# Patient Record
Sex: Female | Born: 1955
Health system: Southern US, Community
[De-identification: ages and names within clinical notes are randomized; demographics above are authoritative.]

## PROBLEM LIST (undated history)

## (undated) DIAGNOSIS — I471 Supraventricular tachycardia, unspecified: Secondary | ICD-10-CM

## (undated) DIAGNOSIS — D649 Anemia, unspecified: Secondary | ICD-10-CM

## (undated) DIAGNOSIS — R002 Palpitations: Secondary | ICD-10-CM

## (undated) DIAGNOSIS — I10 Essential (primary) hypertension: Secondary | ICD-10-CM

## (undated) HISTORY — DX: Palpitations: R00.2

## (undated) HISTORY — PX: TUBAL LIGATION: SHX77

## (undated) HISTORY — PX: BREAST BIOPSY: SHX20

---

## 1999-05-23 ENCOUNTER — Other Ambulatory Visit: Admission: RE | Admit: 1999-05-23 | Discharge: 1999-05-23 | Payer: Self-pay | Admitting: Obstetrics and Gynecology

## 2000-06-09 ENCOUNTER — Other Ambulatory Visit: Admission: RE | Admit: 2000-06-09 | Discharge: 2000-06-09 | Payer: Self-pay | Admitting: Obstetrics and Gynecology

## 2002-12-20 ENCOUNTER — Other Ambulatory Visit: Admission: RE | Admit: 2002-12-20 | Discharge: 2002-12-20 | Payer: Self-pay | Admitting: Obstetrics and Gynecology

## 2004-04-02 ENCOUNTER — Other Ambulatory Visit: Admission: RE | Admit: 2004-04-02 | Discharge: 2004-04-02 | Payer: Self-pay | Admitting: Obstetrics and Gynecology

## 2005-09-23 ENCOUNTER — Other Ambulatory Visit: Admission: RE | Admit: 2005-09-23 | Discharge: 2005-09-23 | Payer: Self-pay | Admitting: Obstetrics and Gynecology

## 2008-09-27 ENCOUNTER — Ambulatory Visit: Payer: Self-pay | Admitting: Cardiology

## 2008-09-27 ENCOUNTER — Inpatient Hospital Stay (HOSPITAL_COMMUNITY): Admission: AD | Admit: 2008-09-27 | Discharge: 2008-09-28 | Payer: Self-pay | Admitting: Internal Medicine

## 2008-09-27 ENCOUNTER — Encounter: Payer: Self-pay | Admitting: Emergency Medicine

## 2008-10-04 ENCOUNTER — Encounter: Payer: Self-pay | Admitting: Cardiology

## 2008-10-04 ENCOUNTER — Ambulatory Visit: Payer: Self-pay | Admitting: Cardiology

## 2008-10-04 ENCOUNTER — Encounter (HOSPITAL_COMMUNITY): Admission: RE | Admit: 2008-10-04 | Discharge: 2008-10-16 | Payer: Self-pay | Admitting: Cardiology

## 2008-10-31 ENCOUNTER — Ambulatory Visit: Payer: Self-pay | Admitting: Cardiology

## 2008-12-12 ENCOUNTER — Ambulatory Visit (HOSPITAL_COMMUNITY): Admission: RE | Admit: 2008-12-12 | Discharge: 2008-12-12 | Payer: Self-pay | Admitting: Pulmonary Disease

## 2009-04-15 DIAGNOSIS — R079 Chest pain, unspecified: Secondary | ICD-10-CM

## 2009-04-15 DIAGNOSIS — R002 Palpitations: Secondary | ICD-10-CM

## 2009-06-14 ENCOUNTER — Ambulatory Visit (HOSPITAL_COMMUNITY): Admission: RE | Admit: 2009-06-14 | Discharge: 2009-06-14 | Payer: Self-pay | Admitting: General Surgery

## 2009-06-14 ENCOUNTER — Encounter (INDEPENDENT_AMBULATORY_CARE_PROVIDER_SITE_OTHER): Payer: Self-pay | Admitting: General Surgery

## 2010-11-09 ENCOUNTER — Encounter: Payer: Self-pay | Admitting: Family Medicine

## 2010-11-09 ENCOUNTER — Encounter: Payer: Self-pay | Admitting: Obstetrics and Gynecology

## 2011-01-24 LAB — BASIC METABOLIC PANEL
Chloride: 104 mEq/L (ref 96–112)
GFR calc non Af Amer: >60 mL/min (ref >60)
Potassium: 4.6 mEq/L (ref 3.5–5.1)
Sodium: 138 mEq/L (ref 135–145)

## 2011-01-24 LAB — CBC
HCT: 35.5 % — ABNORMAL LOW (ref 36.0–46.0)
Hemoglobin: 12 g/dL (ref 12.0–15.0)
MCV: 89.5 fL (ref 78.0–100.0)
RBC: 3.97 MIL/uL (ref 3.87–5.11)
WBC: 4.7 10*3/uL (ref 4.0–10.5)

## 2011-02-03 LAB — BLOOD GAS, ARTERIAL
Bicarbonate: 23.1 mEq/L (ref 20.0–24.0)
TCO2: 21.1 mmol/L (ref 0–100)
pCO2 arterial: 37.7 mmHg (ref 35.0–45.0)
pH, Arterial: 7.404 — ABNORMAL HIGH (ref 7.350–7.400)
pO2, Arterial: 98.5 mmHg (ref 80.0–100.0)

## 2011-03-03 NOTE — Op Note (Signed)
NAMECORREEN, BUBOLZ             ACCOUNT NO.:  [id number]   MEDICAL RECORD NO.:  [id number]          PATIENT TYPE:  AMB   LOCATION:  SDS                          FACILITY:  [facility]   PHYSICIAN:  Gabrielle Dare. Janee Morn, M.D.DATE OF BIRTH:  Jul 25, 1956   DATE OF PROCEDURE:  06/14/2009  DATE OF DISCHARGE:  06/14/2009                               OPERATIVE REPORT   PREOPERATIVE DIAGNOSIS:  Left breast mass.   POSTOPERATIVE DIAGNOSIS:  Left breast mass.   PROCEDURE:  Excision of left breast mass.   SURGEON:  Gabrielle Dare. Janee Morn, MD   ANESTHESIA:  General with laryngeal mask airway.   HISTORY OF PRESENT ILLNESS:  Ms. Lehan is a 55 year old African  American female who found a left breast mass during self breast  examination.  She went for further evaluation at [facility] and ultrasound demonstrated a complex cystic mass, 2.2 cm in  diameter at the 12 o'clock position just above her areola.  Core biopsy  was then done.  This showed a spot of atypical ductal hyperplasia, so  she presents today for excision of this breast mass.   PROCEDURE IN DETAIL:  Informed consent was obtained.  The patient was  identified in the preop holding area.  Her site was marked.  She  received intravenous antibiotics.  She was brought to the operating  room.  General anesthesia with laryngeal mask airway was administered by  the Anesthesia staff.  Her chest and left breast were prepped and draped  in sterile fashion.  Time-out procedure was done.  A segmental  circumareolar incision was made along the 12 o'clock position of her  areola.  Subcutaneous tissues were dissected down.  This mass was  palpable in the breast tissue.  It was circumferentially dissected using  Bovie cautery to get excellent hemostasis and excised in one piece.  This was marked with sutures for anatomic positioning to aid pathology.  The breast area was palpated and no other abnormalities were felt.  The  wound was  copiously irrigated and some 0.25% Marcaine with epinephrine  was injected for postoperative pain relief.  Meticulous hemostasis was  ensured.  Subcutaneous tissues were then approximated with a running 3-0  Vicryl suture, and the skin was closed with running 4-0 Monocryl  subcuticular stitch followed by Dermabond.  Sponge, needle, and  instrument counts were all correct.  The patient tolerated the procedure  well without apparent complication and was taken to the recovery room in  stable condition.      Gabrielle Dare Janee Morn, M.D.  Electronically Signed    BET/MEDQ  D:  06/14/2009  T:  06/15/2009  Job:  [id number]   cc:   Wardell Heath, MD  Oneal Deputy. Juanetta Gosling, M.D.

## 2011-03-03 NOTE — Discharge Summary (Signed)
[name], [name]             ACCOUNT NO.:  [id number]   MEDICAL RECORD NO.:  [id number]          PATIENT TYPE:  INP   LOCATION:  3708                         FACILITY:  [facility]   PHYSICIAN:  Pricilla Riffle, MD, FACCDATE OF BIRTH:  October 04, 1956   DATE OF ADMISSION:  09/27/2008  DATE OF DISCHARGE:  09/28/2008                               DISCHARGE SUMMARY   PROCEDURES:  None.   PRIMARY/FINAL DISCHARGE DIAGNOSIS:  Chest pain, cardiac enzymes negative  for myocardial infarction and outpatient Myoview plan.   SECONDARY DIAGNOSES:  1. Tachy-palpitations, sinus rhythm and occasional sinus tachycardia      seen on telemetry.  2. Hyperlipidemia with a total cholesterol of 219, triglycerides 100,      HDL 34, LDL 165 this admission.  3. Obesity with a body mass index of 35.6.  4. Family history of coronary artery disease in her father.   TIME AT DISCHARGE:  39 minutes.   HOSPITAL COURSE:  [name] is a 55 year old female with no previous  history of coronary artery disease.  She had chest pain and tachy-  palpitations on the day of admission and came to the hospital where she  was admitted for further evaluation.   Her heart rate had normalized and her tachy-palpitations had resolved  prior to arrival in the emergency room.  Initial point-of-care markers  were negative and followup enzymes showed one minimally elevated  troponin of 0.07, but all other troponins and CK-MBs were negative.  Her  chest pain did not return and her EKG was unchanged.   A lipid profile was checked with the results described above.  The  patient states that she can make dietary changes and first to do that at  this time.  She was advised that if dietary changes were not successful,  she would need medication.  She was on fish oil prior to admission and  will continue this.   She had no further episodes of tachy-palpitations overnight.  Telemetry  showed occasional sinus tachycardia, but no  arrhythmia.   On September 28, 2008, [name] was seen by Dr. Dietrich Pates.  Because  her symptoms had resolved and there were no critical abnormalities in  her labs, Dr. Tenny Craw considered her stable for discharge with close  outpatient followup and testing in [place].   LABORATORY VALUES:  TSH and hemoglobin A1c are pending.  D-dimer 0.44  and BNP 35.3.   DISCHARGE INSTRUCTIONS:  1. Her activity level is to be increased gradually.  2. She is encouraged to stick to a low-sodium, heart-healthy diet.  3. She is to call us if she has further episodes of chest pain or      palpitations.  Our office will contact her for a CardioNet monitor.      She will be scheduled for an outpatient Myoview and stress test and      will follow up in our office after that with Dr. Diona Browner on      October 31, 2008, at 3:30.  She is to see Dr. Juanetta Gosling as needed.   DISCHARGE MEDICATIONS:  1. Cozaar 50 mg  daily.  2. Fish oil 1000 mg daily.  3. Prilosec 40 mg daily.  4. Aspirin 325 mg daily.  5. Activella daily.  6. Tylenol p.r.n.  7. Excedrin is to be avoided if possible because it contains caffeine,      and she is encouraged to avoid other sources of caffeine.      Theodore Demark, PA-C      Pricilla Riffle, MD, [facility]  Electronically Signed    RB/MEDQ  D:  09/28/2008  T:  09/28/2008  Job:  [id number]   cc:   Ramon Dredge L. Juanetta Gosling, M.D.

## 2011-03-03 NOTE — Assessment & Plan Note (Signed)
[facility]                       [place] [facility] OFFICE NOTE   FELICITE, ZEIMET                    MRN:          [id number]  DATE:10/31/2008                            DOB:          December 23, 1955    PRIMARY CARE PHYSICIAN:  Ramon Dredge L. Juanetta Gosling, MD   GYNECOLOGIST:  Dineen Kid. Rana Snare, MD   REASON FOR VISIT:  Followup hospitalization.   HISTORY OF PRESENT ILLNESS:  Ms. Gonder was admitted to [facility] back in December with complaints of palpitations and mild chest  discomfort.  She was observed with no significant tachyarrhythmias noted  on telemetry monitoring.  Her cardiac markers were overall reassuring  with the exception of one troponin-I level of 0.07, all others being  normal.  She was discharged from the hospital with plans for a followup  outpatient event recorder and also a Myoview.  She underwent an exercise  Myoview on October 04, 2008, which was interpreted by Dr. Dietrich Pates to  demonstrate no evidence of ischemia with an ejection fraction greater  than 65%.  She continues to wear her event recorder, although states  that she has had no further sense of palpitations or chest pain and has  therefore called in no telemetry strips.  She feels like her  palpitations may have been related to using a higher dose of Activella  for postmenopausal symptoms.  She was taking 1 mg daily at that time and  has subsequently cut it back to 0.5 mg daily with improvement.  Otherwise, she has had no new symptoms.   ALLERGIES:  No known drug allergies.   PRESENT MEDICATIONS:  1. Cozaar 50 mg p.o. daily.  2. Activella 0.5 mg p.o. daily.  3. Aspirin 325 mg p.o. daily.  4. Omega-3 supplements 1000 mg p.o. daily.  5. Calcium and vitamin D 600 mg p.o. daily.  6. Multivitamin 1 p.o. daily.  7. P.r.n. ibuprofen.   REVIEW OF SYSTEMS:  As described in the history of present illness,  otherwise negative.   PHYSICAL EXAMINATION:  VITAL SIGNS:   Blood pressure is 110/80, heart  rate is 76, and weight 232 pounds.  GENERAL:  The patient is comfortable, in no acute distress.  NECK:  No elevated jugular venous pressure.  No loud bruits or  thyromegaly.  LUNGS:  Clear without labored breathing at rest.  CARDIAC:  Regular rate and rhythm.  No pathologic systolic murmur or  pericardial rub.  No S3 gallop.  ABDOMEN:  Soft, nontender.  Normoactive bowel sounds.  EXTREMITIES:  Exhibit no significant pitting edema.  Distal pulses are  2+.  SKIN:  Warm and dry.   IMPRESSION AND RECOMMENDATIONS:  Episode of palpitations and chest pain  back in December with hospital observation revealing no definitive  dysrhythmias or clear evidence of acute coronary syndrome.  Subsequent  outpatient Myoview demonstrated no active ischemia with normal left  ventricular systolic function.  She has had no further symptoms and cut  back her Activella to 0.5 mg daily, perhaps this was related.  She  continues to wear her event recorder through the 19th of this month and  I  encouraged her to do so.  If no dysrhythmias are uncovered, we would  suggest basic observation with her primary care physician and we can see  her back as needed.  She was comfortable with this.     Jonelle Sidle, MD  Electronically Signed    SGM/MedQ  DD: 10/31/2008  DT: 11/01/2008  Job #: [id number]   cc:   Ramon Dredge L. Juanetta Gosling, M.D.  Dineen Kid Rana Snare, M.D.

## 2011-03-03 NOTE — H&P (Signed)
[name], [name]             ACCOUNT NO.:  [id number]   MEDICAL RECORD NO.:  [id number]          PATIENT TYPE:  INP   LOCATION:  3708                         FACILITY:  [facility]   PHYSICIAN:  Marca Ancona, MD      DATE OF BIRTH:  1956/07/08   DATE OF ADMISSION:  09/27/2008  DATE OF DISCHARGE:                              HISTORY & PHYSICAL   PRIMARY CARDIOLOGIST:  Milus Banister, MD   PRIMARY CARE PHYSICIAN:  Oneal Deputy. Juanetta Gosling, MD   HISTORY OF PRESENT ILLNESS:  This is a 55 year old obese African  American female with a past medical history of hypertension and  hypercholesterolemia, who while shopping today, as leaving the store,  began to have a rapid heart rate.  She sat in her car and took some deep  breaths and hoping that the heart rate would come down on its own.  She  began to have some mild chest pressure associated with this.  The  patient admits to having an episode in the past, lasting approximately 2  minutes, and went away on its own, but today it continued for  approximately 10 minutes or longer and the patient took an aspirin and  also took an extra dose of her Cozaar 50 mg and presented to [facility].  Her primary care physician was called, Dr. Kari Baars,  who also was in touch with Dr. Juanito Doom and his advise was to admit the  patient to [facility] for further evaluation.  On arrival, the  patient was given nitroglycerin sublingual and the chest pressure was  relieved and she stated that her heart rate starting going down while  she was in the emergency room without any treatment at that time.  The  patient is currently without any complaints of chest pain or shortness  of breath.  She has no complaints of recurrence of the tachycardia.  On  arrival in [place], the patient's blood pressure was 122/72 with a  heart rate of 92.  There was documentation of a heart rate of 103.  The  patient is currently with a heart rate of 80 with a  blood pressure of  154 systolic.   REVIEW OF SYSTEMS:  Positive for tachycardia and mild shortness of  breath with chest pressure, otherwise negative.   PAST MEDICAL HISTORY:  1. Hypertension, ongoing for 5 years.  2. Hypercholesterolemia.   PAST SURGICAL HISTORY:  None.   SOCIAL HISTORY:  Negative for tobacco or EtOH.  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  She is married, with 3 children and 2  stepchildren.   FAMILY HISTORY:  Her father with heart trouble.  Mother with  hypertension and diabetes and a sister with diabetes.   CURRENT MEDICATIONS:  1. Cozaar 50 mg daily.  2. Fish oil 1000 mg daily.  3. Prilosec 40 mg daily.  4. Aspirin 325 mg daily.  5. Excedrin p.r.n.   ALLERGIES:  No known drug allergies.   LABORATORY DATA:  Current labs; hemoglobin 12.0, hematocrit 35.5, white  blood cells 4.6, platelets 219.  Troponin less than 0.05.  Sodium 136,  potassium 3.8, chloride 107, CO2 of 25, BUN 15, creatinine 0.79, glucose  118.  D-dimer is 0.44.  BNP 35.3.  EKG revealing sinus tachycardia,  ventricular rate of 91 beats per minute.   PHYSICAL EXAMINATION:  VITAL SIGNS:  Blood pressure 152/78, heart rate  86, respirations 20.  HEENT:  Head is normocephalic and atraumatic.  Eyes, PERRLA.  Mucous  membranes, mouth pink and moist.  Tongue is midline.  NECK:  Supple.  There is no JVD or carotid bruits appreciated.  CARDIOVASCULAR:  Regular rate and rhythm without murmurs, rubs, or  gallops, but she does have a positive S4 noted.  LUNGS:  Clear to auscultation.  ABDOMEN:  Soft, nontender with 2+ bowel sounds.  EXTREMITIES:  Without clubbing, cyanosis, or edema.  NEUROLOGIC:  Cranial nerves II-XII are grossly intact.   IMPRESSION:  1. Transient tachycardia.  2. Hypertension.  3. History of hypercholesterolemia.   PLAN:  This is a 55 year old obese African American female who presented  to [facility] secondary to tachycardia and some mild chest   pressure.  Her chest pressure revealed with nitroglycerin, tachycardia  slowly returned to normal on its own prior to arrival in the emergency  room.  We will admit the patient.  Check cardiac enzymes.  We will check  magnesium and TSH.  We will cycle enzymes.  We will order an  echocardiogram for LV function.  The patient has good exercise tolerance  at baseline.  Plan to follow up with outpatient stress Myoview if  cardiac enzymes are negative.  Making further recommendations throughout  hospital course depending upon telemetry overnight and the patient's  response.      Bettey Mare. Lyman Bishop, NP      Marca Ancona, MD  Electronically Signed    KML/MEDQ  D:  09/27/2008  T:  09/28/2008  Job:  [id number]   cc:   Ramon Dredge L. Juanetta Gosling, M.D.

## 2011-03-06 NOTE — Procedures (Signed)
[name], [name]             ACCOUNT NO.:  [id number]   MEDICAL RECORD NO.:  [id number]          PATIENT TYPE:  OUT   LOCATION:  RESP                          FACILITY:  [facility]   PHYSICIAN:  Edward L. Juanetta Gosling, M.D.DATE OF BIRTH:  12/07/1955   DATE OF PROCEDURE:  DATE OF DISCHARGE:  12/12/2008                            PULMONARY FUNCTION TEST   1. Spirometry is normal.  2. Lung volumes are normal.  3. DLCO is mildly reduced.  4. Arterial blood gases are normal.      Edward L. Juanetta Gosling, M.D.  Electronically Signed     ELH/MEDQ  D:  12/15/2008  T:  12/15/2008  Job:  [id number]

## 2011-07-24 LAB — BASIC METABOLIC PANEL
BUN: 11 mg/dL (ref 6–23)
Calcium: 8.9 mg/dL (ref 8.4–10.5)
Calcium: 8.9 mg/dL (ref 8.4–10.5)
Creatinine, Ser: 0.79 mg/dL (ref 0.4–1.2)
GFR calc non Af Amer: >60 mL/min (ref >60)
GFR calc non Af Amer: >60 mL/min (ref >60)
Glucose, Bld: 118 mg/dL — ABNORMAL HIGH (ref 70–99)
Glucose, Bld: 95 mg/dL (ref 70–99)
Potassium: 4.1 mEq/L (ref 3.5–5.1)
Sodium: 136 mEq/L (ref 135–145)
Sodium: 142 mEq/L (ref 135–145)

## 2011-07-24 LAB — PROTIME-INR: INR: 1.1 (ref 0.00–1.49)

## 2011-07-24 LAB — CARDIAC PANEL(CRET KIN+CKTOT+MB+TROPI)
CK, MB: 2.6 ng/mL (ref 0.3–4.0)
CK, MB: 3.3 ng/mL (ref 0.3–4.0)
Relative Index: 2.7 — ABNORMAL HIGH (ref 0.0–2.5)
Relative Index: INVALID (ref 0.0–2.5)
Total CK: 90 U/L (ref 7–177)
Troponin I: 0.03 ng/mL (ref 0.00–0.06)
Troponin I: 0.07 ng/mL — ABNORMAL HIGH (ref 0.00–0.06)

## 2011-07-24 LAB — TSH: TSH: 2.38 u[IU]/mL (ref 0.350–4.500)

## 2011-07-24 LAB — B-NATRIURETIC PEPTIDE (CONVERTED LAB): Pro B Natriuretic peptide (BNP): 35.3 pg/mL (ref 0.0–100.0)

## 2011-07-24 LAB — CBC
Hemoglobin: 12 g/dL (ref 12.0–15.0)
Platelets: 219 10*3/uL (ref 150–400)
RDW: 13.5 % (ref 11.5–15.5)

## 2011-07-24 LAB — LIPID PANEL
HDL: 34 mg/dL — ABNORMAL LOW (ref >39)
Triglycerides: 100 mg/dL (ref <150)
VLDL: 20 mg/dL (ref 0–40)

## 2011-07-24 LAB — POCT CARDIAC MARKERS
Troponin i, poc: <0.05 ng/mL (ref 0.00–0.09)
Troponin i, poc: <0.05 ng/mL (ref 0.00–0.09)

## 2011-07-24 LAB — APTT: aPTT: 29 seconds (ref 24–37)

## 2011-07-24 LAB — D-DIMER, QUANTITATIVE: D-Dimer, Quant: 0.44 ug/mL-FEU (ref 0.00–0.48)

## 2011-10-15 ENCOUNTER — Encounter: Payer: Self-pay | Admitting: Cardiology

## 2012-02-02 ENCOUNTER — Ambulatory Visit: Payer: Self-pay

## 2012-02-02 ENCOUNTER — Other Ambulatory Visit: Payer: Self-pay | Admitting: Orthopedic Surgery

## 2012-02-02 DIAGNOSIS — M545 Low back pain: Secondary | ICD-10-CM

## 2012-04-04 ENCOUNTER — Encounter (HOSPITAL_COMMUNITY): Payer: Self-pay | Admitting: Emergency Medicine

## 2012-04-04 ENCOUNTER — Emergency Department (HOSPITAL_COMMUNITY)
Admission: EM | Admit: 2012-04-04 | Discharge: 2012-04-04 | Disposition: A | Discharge status: Home / Self Care | Payer: 59 | Source: Home / Self Care | Attending: Emergency Medicine | Admitting: Emergency Medicine

## 2012-04-04 DIAGNOSIS — N3000 Acute cystitis without hematuria: Secondary | ICD-10-CM

## 2012-04-04 LAB — POCT URINALYSIS DIP (DEVICE)
Glucose, UA: NEGATIVE mg/dL
Nitrite: POSITIVE — AB
Urobilinogen, UA: 1 mg/dL (ref 0.0–1.0)

## 2012-04-04 MED ORDER — CEPHALEXIN 500 MG PO CAPS: 500.0000 mg | ORAL_CAPSULE | Freq: Three times a day (TID) | ORAL | Status: AC

## 2012-04-04 MED ORDER — PHENAZOPYRIDINE HCL 200 MG PO TABS: 200.0000 mg | ORAL_TABLET | Freq: Three times a day (TID) | ORAL | Status: AC | PRN

## 2012-04-04 NOTE — ED Provider Notes (Signed)
Chief Complaint  Patient presents with  . Urinary Tract Infection    History of Present Illness:   The patient is a 56 year old female who has had a two-day history of dysuria, frequency, urgency, and a small amount of blood in her urine. She notes suprapubic fullness and pressure but no lower back pain. She denies fever, chills, nausea, or vomiting. She's had slight vaginal itching but no discharge or abnormal bleeding. She has had urinary tract infections before but these have been years ago.  Review of Systems:  Other than noted above, the patient denies any of the following symptoms: General:  No fevers, chills, sweats, aches, or fatigue. GI:  No abdominal pain, back pain, nausea, vomiting, diarrhea, or constipation. GU:  No dysuria, frequency, urgency, hematuria, or incontinence. GYN:  No discharge, itching, vulvar pain or lesions, pelvic pain, or abnormal vaginal bleeding.  PMFSH:  Past medical history, family history, social history, meds, and allergies were reviewed.  Physical Exam:   Vital signs:  BP 149/88  Pulse 72  Temp 98.2 F (36.8 C) (Oral)  Resp 16  SpO2 98% Gen:  Alert, oriented, in no distress. Lungs:  Clear to auscultation, no wheezes, rales or rhonchi. Heart:  Regular rhythm, no gallop or murmer. Abdomen:  Flat and soft. There was slight suprapubic pain to palpation.  No guarding, or rebound.  No hepato-splenomegaly or mass.  Bowel sounds were normally active.  No hernia. Back:  No CVA tenderness.  Skin:  Clear, warm and dry.  Labs:   Results for orders placed during the hospital encounter of 04/04/12  POCT URINALYSIS DIP (DEVICE)      Component Value Range   Glucose, UA NEGATIVE  NEGATIVE mg/dL   Bilirubin Urine SMALL (*) NEGATIVE   Ketones, ur TRACE (*) NEGATIVE mg/dL   Specific Gravity, Urine 1.025  1.005 - 1.030   Hgb urine dipstick LARGE (*) NEGATIVE   pH 5.5  5.0 - 8.0   Protein, ur 30 (*) NEGATIVE mg/dL   Urobilinogen, UA 1.0  0.0 - 1.0 mg/dL   Nitrite POSITIVE (*) NEGATIVE   Leukocytes, UA SMALL (*) NEGATIVE    Other Labs Obtained at Urgent Care Center:  A urine culture was obtained.  Results are pending at this time and we will call about any positive results.   Assessment: The encounter diagnosis was Acute cystitis.   Plan:   1.  The following meds were prescribed:   New Prescriptions   CEPHALEXIN (KEFLEX) 500 MG CAPSULE    Take 1 capsule (500 mg total) by mouth 3 (three) times daily.   PHENAZOPYRIDINE (PYRIDIUM) 200 MG TABLET    Take 1 tablet (200 mg total) by mouth 3 (three) times daily as needed for pain.   2.  The patient was instructed in symptomatic care and handouts were given. 3.  The patient was told to return if becoming worse in any way, if no better in 3 or 4 days, and given some red flag symptoms that would indicate earlier return. 4.  The patient was told to avoid intercourse for 10 days, get extra fluids, and return for a follow up with her primary care doctor at the completion of treatment for a repeat UA and culture.     Reuben Likes, MD 04/04/12 [phone]

## 2012-04-04 NOTE — ED Notes (Signed)
Pt here with uti sx freq/urge bladder pressure and slight burn/itch that started yesterday morning.denies fever,chills or back pain

## 2012-04-04 NOTE — Discharge Instructions (Signed)

## 2012-04-07 LAB — URINE CULTURE
Culture  Setup Time: 201306180131
Special Requests: NORMAL

## 2012-07-29 ENCOUNTER — Other Ambulatory Visit: Payer: Self-pay | Admitting: Obstetrics and Gynecology

## 2013-02-09 ENCOUNTER — Emergency Department (HOSPITAL_COMMUNITY)
Admission: EM | Admit: 2013-02-09 | Discharge: 2013-02-09 | Disposition: A | Discharge status: Home / Self Care | Payer: 59 | Attending: Emergency Medicine | Admitting: Emergency Medicine

## 2013-02-09 ENCOUNTER — Emergency Department (HOSPITAL_COMMUNITY): Payer: 59

## 2013-02-09 ENCOUNTER — Encounter (HOSPITAL_COMMUNITY): Payer: Self-pay | Admitting: *Deleted

## 2013-02-09 DIAGNOSIS — I498 Other specified cardiac arrhythmias: Secondary | ICD-10-CM | POA: Insufficient documentation

## 2013-02-09 DIAGNOSIS — I471 Supraventricular tachycardia: Secondary | ICD-10-CM

## 2013-02-09 DIAGNOSIS — Z79899 Other long term (current) drug therapy: Secondary | ICD-10-CM | POA: Insufficient documentation

## 2013-02-09 DIAGNOSIS — I10 Essential (primary) hypertension: Secondary | ICD-10-CM | POA: Insufficient documentation

## 2013-02-09 HISTORY — DX: Essential (primary) hypertension: I10

## 2013-02-09 HISTORY — DX: Supraventricular tachycardia, unspecified: I47.10

## 2013-02-09 HISTORY — DX: Supraventricular tachycardia: I47.1

## 2013-02-09 LAB — CBC WITH DIFFERENTIAL/PLATELET
Basophils Absolute: 0 10*3/uL (ref 0.0–0.1)
Basophils Relative: 1 % (ref 0–1)
Eosinophils Absolute: 0.2 10*3/uL (ref 0.0–0.7)
Eosinophils Relative: 3 % (ref 0–5)
HCT: 34.4 % — ABNORMAL LOW (ref 36.0–46.0)
Hemoglobin: 11.9 g/dL — ABNORMAL LOW (ref 12.0–15.0)
MCH: 29.9 pg (ref 26.0–34.0)
MCHC: 34.6 g/dL (ref 30.0–36.0)
MCV: 86.4 fL (ref 78.0–100.0)
Monocytes Absolute: 0.3 10*3/uL (ref 0.1–1.0)
Monocytes Relative: 6 % (ref 3–12)
RDW: 13.8 % (ref 11.5–15.5)

## 2013-02-09 LAB — BASIC METABOLIC PANEL
BUN: 17 mg/dL (ref 6–23)
Calcium: 8.8 mg/dL (ref 8.4–10.5)
Chloride: 107 mEq/L (ref 96–112)
Creatinine, Ser: 1.04 mg/dL (ref 0.50–1.10)
GFR calc Af Amer: 67 mL/min — ABNORMAL LOW (ref >90)

## 2013-02-09 MED ORDER — ADENOSINE 6 MG/2ML IV SOLN
6.0000 mg | Freq: Once | INTRAVENOUS | Status: AC
  Administered 2013-02-09: 6 mg via INTRAVENOUS

## 2013-02-09 MED ORDER — SODIUM CHLORIDE 0.9 % IV BOLUS (SEPSIS)
500.0000 mL | Freq: Once | INTRAVENOUS | Status: AC
  Administered 2013-02-09: 500 mL via INTRAVENOUS

## 2013-02-09 MED ORDER — ADENOSINE 6 MG/2ML IV SOLN
INTRAVENOUS | Status: AC
  Administered 2013-02-09: 6 mg via INTRAVENOUS
  Filled 2013-02-09: qty 10

## 2013-02-09 NOTE — ED Provider Notes (Addendum)
History    This chart was scribed for Donnetta Hutching, MD by Quintella Reichert, ED scribe.  This patient was seen in room APA02/APA02 and the patient's care was started at 11:50 AM.   CSN: [id number]  Arrival date & time 02/09/13  1129      Chief Complaint  Patient presents with  . Tachycardia   Level V caveat for urgent need for intervention  The history is provided by the patient. No language interpreter was used.   [name] is a 57 y.o. female who presents to the Emergency Department complaining of tachycardia that began this morning without CP or dyspnea.  Pt has 1 previous episode of tachycardia approximately 2 years ago. She has h/o HTN.  Pt states symptoms are not relieved or made worse by anything.  Severity is moderate.   Past Medical History  Diagnosis Date  . Chest pain   . Palpitations   . Hypertension     Past Surgical History  Procedure Laterality Date  . Tubal ligation      Family History  Problem Relation Age of Onset  . Diabetes Other   . COPD Other   . Hypertension Other   . Heart disease Other     History  Substance Use Topics  . Smoking status: Never Smoker   . Smokeless tobacco: Not on file  . Alcohol Use: No    OB History   Grav Para Term Preterm Abortions TAB SAB Ect Mult Living                  Review of Systems A complete 10 system review of systems was obtained and all systems are negative except as noted in the HPI and PMH.    Allergies  Lodine  Home Medications   Current Outpatient Rx  Name  Route  Sig  Dispense  Refill  . hydrochlorothiazide (HYDRODIURIL) 12.5 MG tablet   Oral   Take 12.5 mg by mouth daily.         Marland Kitchen losartan (COZAAR) 50 MG tablet   Oral   Take 50 mg by mouth daily.         Marland Kitchen omeprazole (PRILOSEC) 20 MG capsule   Oral   Take 20 mg by mouth daily.           BP 90/68  Pulse 177  Temp(Src) 97.8 F (36.6 C) (Oral)  Resp 20  Wt 225 lb (102.059 kg)  SpO2 96%  Physical Exam  Nursing  note and vitals reviewed. Constitutional: She is oriented to person, place, and time. She appears well-developed and well-nourished.  Good color, no acute distress  HENT:  Head: Normocephalic and atraumatic.  Eyes: Conjunctivae and EOM are normal. Pupils are equal, round, and reactive to light.  Neck: Normal range of motion. Neck supple.  Cardiovascular: Regular rhythm and normal heart sounds.   Tachycardic   Pulmonary/Chest: Effort normal and breath sounds normal.  Abdominal: Soft. Bowel sounds are normal.  Musculoskeletal: Normal range of motion.  Neurological: She is alert and oriented to person, place, and time.  Skin: Skin is warm and dry.  Psychiatric: She has a normal mood and affect.     ED Course  Procedures (including critical care time)  DIAGNOSTIC STUDIES: Oxygen Saturation is 96% on room air, normal by my interpretation.    COORDINATION OF CARE: 11:52 AM-Discussed treatment plan which includes IV fluids with pt at bedside and pt agreed to plan.   11:53 IO:NGEXBMWUX 6mg  IV given,  which converted pt to normal sinus rhythm, rate of 96.   .   Labs Reviewed  CBC WITH DIFFERENTIAL - Abnormal; Notable for the following:    Hemoglobin 11.9 (*)    HCT 34.4 (*)    All other components within normal limits  BASIC METABOLIC PANEL - Abnormal; Notable for the following:    Glucose, Bld 113 (*)    GFR calc non Af Amer 58 (*)    GFR calc Af Amer 67 (*)    All other components within normal limits  TROPONIN I   Dg Chest Portable 1 View  02/09/2013  *RADIOLOGY REPORT*  Clinical Data: Tachycardia  PORTABLE CHEST - 1 VIEW  Comparison: Portable exam 1203 hours compared to 06/12/2009  Findings: External pacing leads. Upper normal heart size. Mediastinal contours and pulmonary vascularity normal. Lungs clear. No pleural effusion or pneumothorax. Bones unremarkable.  IMPRESSION: No acute abnormalities.   Original Report Authenticated By: Ulyses Southward, M.D.    CRITICAL  CARE Performed by: Donnetta Hutching  ?  Total critical care time: 30  Critical care time was exclusive of separately billable procedures and treating other patients.  Critical care was necessary to treat or prevent imminent or life-threatening deterioration.  Critical care was time spent personally by me on the following activities: development of treatment plan with patient and/or surrogate as well as nursing, discussions with consultants, evaluation of patient's response to treatment, examination of patient, obtaining history from patient or surrogate, ordering and performing treatments and interventions, ordering and review of laboratory studies, ordering and review of radiographic studies, pulse oximetry and re-evaluation of patient's condition.  No diagnosis found..    EKG #1....11:41.....Marland KitchenSVT Rate 179, ST dep inf and lat  EKG#2....Marland KitchenMarland Kitchen12:01.....Marland KitchenNSR  Rate 92  MDM  Initial rhythm was supraventricular tachycardia rate 170 .  Patient converted to normal sinus rhythm after adenosine 6 mg IV. Discussed with Dr. Juanetta Gosling. He will see patient in followup      *I personally performed the services described in this documentation, which was scribed in my presence. The recorded information has been reviewed and is accurate.    Donnetta Hutching, MD 02/09/13 1319  Donnetta Hutching, MD 02/09/13 1409

## 2013-02-09 NOTE — ED Notes (Signed)
Patient with no complaints at this time. Respirations even and unlabored. Skin warm/dry. Discharge instructions reviewed with patient at this time. Patient given opportunity to voice concerns/ask questions. IV removed per policy and band-aid applied to site. Patient discharged at this time and left Emergency Department with steady gait.  

## 2013-02-09 NOTE — ED Notes (Addendum)
Patient NSR on monitor, converted after 6 mg Adenosine given IV. States she is feeling better.

## 2013-02-09 NOTE — ED Notes (Signed)
Patient with SVT cardiac rhythm with heart rate 180. Two IVs established. Patient attached to appropriate cardiac monitoring and crash cart Zoll for conversion. Explained procedure to patient. Dr Adriana Simas at bedside for cardiac conversion.

## 2013-02-09 NOTE — ED Notes (Signed)
Sudden onset rapid heart rate.  "tightness" in chest

## 2013-04-09 ENCOUNTER — Encounter (HOSPITAL_COMMUNITY): Payer: Self-pay

## 2013-04-09 ENCOUNTER — Emergency Department (HOSPITAL_COMMUNITY)
Admission: EM | Admit: 2013-04-09 | Discharge: 2013-04-09 | Disposition: A | Discharge status: Home / Self Care | Payer: 59 | Source: Home / Self Care | Attending: Family Medicine | Admitting: Family Medicine

## 2013-04-09 DIAGNOSIS — R350 Frequency of micturition: Secondary | ICD-10-CM

## 2013-04-09 LAB — POCT URINALYSIS DIP (DEVICE)
Glucose, UA: NEGATIVE mg/dL
Specific Gravity, Urine: >=1.03 (ref 1.005–1.030)
Urobilinogen, UA: 1 mg/dL (ref 0.0–1.0)

## 2013-04-09 MED ORDER — ONDANSETRON 4 MG PO TBDP: 4.0000 mg | ORAL_TABLET | Freq: Two times a day (BID) | ORAL | Status: DC | PRN

## 2013-04-09 MED ORDER — TRAMADOL HCL 50 MG PO TABS: 50.0000 mg | ORAL_TABLET | Freq: Four times a day (QID) | ORAL | Status: DC | PRN

## 2013-04-09 MED ORDER — CEPHALEXIN 500 MG PO CAPS: 500.0000 mg | ORAL_CAPSULE | Freq: Two times a day (BID) | ORAL | Status: DC

## 2013-04-09 NOTE — ED Provider Notes (Signed)
History     CSN: [id number]  Arrival date & time 04/09/13  1102   First MD Initiated Contact with Patient 04/09/13 1120      Chief Complaint  Patient presents with  . Urinary Frequency    (Consider location/radiation/quality/duration/timing/severity/associated sxs/prior treatment) HPI Comments: 57 year old nondiabetic non smoker female. Here complaining of urinary frequency and low abdominal cramping while voiding and strong oddor in her urine for 3 days. Denies burning on urination fevers chills. No back pain. Patient also reports associated nausea and decreased appetite but no vomiting. Has noted increased thirst. No diarrhea. No recent weight change. No incontinence. Was seen with similar symptoms last year Dx. with acute cystitis urine culture grew >100.000col mix bacteria. Symptoms cleared with Kelfex Tx.    Past Medical History  Diagnosis Date  . Chest pain   . Palpitations   . Hypertension   . SVT (supraventricular tachycardia)     Past Surgical History  Procedure Laterality Date  . Tubal ligation      Family History  Problem Relation Age of Onset  . Diabetes Other   . COPD Other   . Hypertension Other   . Heart disease Other     History  Substance Use Topics  . Smoking status: Never Smoker   . Smokeless tobacco: Not on file  . Alcohol Use: No    OB History   Grav Para Term Preterm Abortions TAB SAB Ect Mult Living                  Review of Systems  Constitutional: Positive for appetite change. Negative for fever, chills, diaphoresis and fatigue.  Gastrointestinal: Positive for nausea. Negative for vomiting, abdominal pain and diarrhea.  Genitourinary: Positive for frequency. Negative for hematuria, flank pain, vaginal bleeding and vaginal discharge.  Skin: Negative for rash.    Allergies  Lodine  Home Medications   Current Outpatient Rx  Name  Route  Sig  Dispense  Refill  . acetaminophen (TYLENOL) 500 MG tablet   Oral   Take 500 mg by  mouth every 6 (six) hours as needed for pain.         Marland Kitchen aspirin 325 MG tablet   Oral   Take 325 mg by mouth daily.         . Aspirin-Acetaminophen-Caffeine (EXCEDRIN PO)   Oral   Take 1 tablet by mouth every 6 (six) hours as needed (pain).         Marland Kitchen b complex vitamins tablet   Oral   Take 1 tablet by mouth daily.         . cholecalciferol (VITAMIN D) 1000 UNITS tablet   Oral   Take 1,000 Units by mouth daily.         Marland Kitchen estradiol-norethindrone (ACTIVELLA) 1-0.5 MG per tablet   Oral   Take 1 tablet by mouth daily.         . hydrochlorothiazide (HYDRODIURIL) 12.5 MG tablet   Oral   Take 12.5 mg by mouth daily.         Marland Kitchen losartan (COZAAR) 50 MG tablet   Oral   Take 50 mg by mouth daily.         . Multiple Vitamins-Minerals (MULTIVITAMINS THER. W/MINERALS) TABS   Oral   Take 1 tablet by mouth daily.         . cephALEXin (KEFLEX) 500 MG capsule   Oral   Take 1 capsule (500 mg total) by mouth 2 (two) times daily.  14 capsule   0   . ondansetron (ZOFRAN-ODT) 4 MG disintegrating tablet   Oral   Take 1 tablet (4 mg total) by mouth every 12 (twelve) hours as needed for nausea.   10 tablet   0   . traMADol (ULTRAM) 50 MG tablet   Oral   Take 1 tablet (50 mg total) by mouth every 6 (six) hours as needed for pain.   15 tablet   0     BP 107/63  Pulse 79  Temp(Src) 97.9 F (36.6 C) (Oral)  SpO2 96%  Physical Exam  Nursing note and vitals reviewed. Constitutional: She is oriented to person, place, and time. She appears well-developed and well-nourished. No distress.  HENT:  Mouth/Throat: Oropharynx is clear and moist.  Cardiovascular: Normal heart sounds.   Pulmonary/Chest: Breath sounds normal.  Abdominal: She exhibits no mass. There is no rebound and no guarding.  Suprapubic discomfort with deep palpation. No costovertebral tenderness bilaterally.  Lymphadenopathy:    She has no cervical adenopathy.  Neurological: She is alert and oriented  to person, place, and time.  Skin: No rash noted. She is not diaphoretic.    ED Course  Procedures (including critical care time)  Labs Reviewed  POCT URINALYSIS DIP (DEVICE) - Abnormal; Notable for the following:    Bilirubin Urine SMALL (*)    Ketones, ur TRACE (*)    Hgb urine dipstick SMALL (*)    All other components within normal limits  URINE CULTURE   No results found.   1. Urinary frequency       MDM  Urine culture pending. Treated with Keflex, tramadol and ondansetron. Possible UTI versus other diagnoses like interstitial cystitis and nephrolithiasis discussed with patient. Supportive care and red flags should prompt her return to medical attention discussed with patient and provided in writing. Urology referral followup as needed.        Sharin Grave, MD 04/11/13 [phone]

## 2013-04-09 NOTE — ED Notes (Signed)
C/o frequent urination with some abdominal cramping and nausea, sx started 3 days ago

## 2013-04-10 LAB — URINE CULTURE: Special Requests: NORMAL

## 2013-04-18 ENCOUNTER — Encounter (HOSPITAL_COMMUNITY): Payer: Self-pay | Admitting: Emergency Medicine

## 2013-04-18 ENCOUNTER — Emergency Department (HOSPITAL_COMMUNITY): Payer: 59

## 2013-04-18 ENCOUNTER — Emergency Department (HOSPITAL_COMMUNITY)
Admission: EM | Admit: 2013-04-18 | Discharge: 2013-04-18 | Disposition: A | Discharge status: Home / Self Care | Payer: 59 | Attending: Emergency Medicine | Admitting: Emergency Medicine

## 2013-04-18 ENCOUNTER — Other Ambulatory Visit: Payer: Self-pay

## 2013-04-18 DIAGNOSIS — Z79899 Other long term (current) drug therapy: Secondary | ICD-10-CM | POA: Insufficient documentation

## 2013-04-18 DIAGNOSIS — I10 Essential (primary) hypertension: Secondary | ICD-10-CM | POA: Insufficient documentation

## 2013-04-18 DIAGNOSIS — I471 Supraventricular tachycardia, unspecified: Secondary | ICD-10-CM | POA: Insufficient documentation

## 2013-04-18 DIAGNOSIS — Z7982 Long term (current) use of aspirin: Secondary | ICD-10-CM | POA: Insufficient documentation

## 2013-04-18 LAB — CBC WITH DIFFERENTIAL/PLATELET
Basophils Relative: 1 % (ref 0–1)
Eosinophils Absolute: 0.2 10*3/uL (ref 0.0–0.7)
Eosinophils Relative: 4 % (ref 0–5)
Lymphs Abs: 1.4 10*3/uL (ref 0.7–4.0)
MCH: 30.1 pg (ref 26.0–34.0)
MCHC: 34.8 g/dL (ref 30.0–36.0)
MCV: 86.4 fL (ref 78.0–100.0)
Monocytes Relative: 9 % (ref 3–12)
Platelets: 194 10*3/uL (ref 150–400)
RBC: 3.82 MIL/uL — ABNORMAL LOW (ref 3.87–5.11)

## 2013-04-18 LAB — BASIC METABOLIC PANEL
BUN: 16 mg/dL (ref 6–23)
Calcium: 8.8 mg/dL (ref 8.4–10.5)
GFR calc Af Amer: 88 mL/min — ABNORMAL LOW (ref >90)
GFR calc non Af Amer: 76 mL/min — ABNORMAL LOW (ref >90)
Glucose, Bld: 119 mg/dL — ABNORMAL HIGH (ref 70–99)
Sodium: 138 mEq/L (ref 135–145)

## 2013-04-18 MED ORDER — SODIUM CHLORIDE 0.9 % IV SOLN: Freq: Once | INTRAVENOUS | Status: DC

## 2013-04-18 MED ORDER — ADENOSINE 6 MG/2ML IV SOLN
12.0000 mg | Freq: Once | INTRAVENOUS | Status: AC
  Administered 2013-04-18: 12 mg via INTRAVENOUS

## 2013-04-18 MED ORDER — DILTIAZEM HCL 30 MG PO TABS
180.0000 mg | ORAL_TABLET | Freq: Once | ORAL | Status: AC
Start: 2013-04-18 — End: 2013-04-18
  Administered 2013-04-18: 180 mg via ORAL

## 2013-04-18 MED ORDER — METOPROLOL TARTRATE 25 MG PO TABS: ORAL_TABLET | ORAL | Status: AC

## 2013-04-18 MED ORDER — DILTIAZEM HCL 30 MG PO TABS
ORAL_TABLET | ORAL | Status: AC
  Filled 2013-04-18: qty 6

## 2013-04-18 MED ORDER — ADENOSINE 6 MG/2ML IV SOLN
INTRAVENOUS | Status: AC
  Administered 2013-04-18: 6 mg
  Filled 2013-04-18: qty 6

## 2013-04-18 MED ORDER — DILTIAZEM HCL 25 MG/5ML IV SOLN: 20.0000 mg | Freq: Once | INTRAVENOUS | Status: DC

## 2013-04-18 MED ORDER — SODIUM CHLORIDE 0.9 % IV BOLUS (SEPSIS)
1000.0000 mL | Freq: Once | INTRAVENOUS | Status: AC
  Administered 2013-04-18: 1000 mL via INTRAVENOUS

## 2013-04-18 MED ORDER — METOPROLOL TARTRATE 50 MG PO TABS: ORAL_TABLET | ORAL | Status: DC

## 2013-04-18 NOTE — ED Provider Notes (Signed)
History    CSN: [id number] Arrival date & time 04/18/13  0602  First MD Initiated Contact with Patient 04/18/13 [phone]     Chief Complaint  Patient presents with  . Chest Pain   (Consider location/radiation/quality/duration/timing/severity/associated sxs/prior Treatment) HPI HPI Comments: [name] is a 57 y.o. female who presents to the Emergency Department complaining of rapid heart rate that woke her at 530 AM. She has a h/o SVT with adenosine conversion on 02/10/13. She did not follow up with a cardiologist or with Dr. Juanetta Gosling. She is not currently on either cardizen or a beta blocker.   PCP Dr. Juanetta Gosling Past Medical History  Diagnosis Date  . Chest pain   . Palpitations   . Hypertension   . SVT (supraventricular tachycardia)    Past Surgical History  Procedure Laterality Date  . Tubal ligation     Family History  Problem Relation Age of Onset  . Diabetes Other   . COPD Other   . Hypertension Other   . Heart disease Other    History  Substance Use Topics  . Smoking status: Never Smoker   . Smokeless tobacco: Not on file  . Alcohol Use: No   OB History   Grav Para Term Preterm Abortions TAB SAB Ect Mult Living                 Review of Systems  Constitutional: Negative for fever.       10 Systems reviewed and are negative for acute change except as noted in the HPI.  HENT: Negative for congestion.   Eyes: Negative for discharge and redness.  Respiratory: Negative for cough and shortness of breath.   Cardiovascular: Negative for chest pain.       Svt  Gastrointestinal: Negative for vomiting and abdominal pain.  Musculoskeletal: Negative for back pain.  Skin: Negative for rash.  Neurological: Negative for syncope, numbness and headaches.  Psychiatric/Behavioral:       No behavior change.    Allergies  Lodine  Home Medications   Current Outpatient Rx  Name  Route  Sig  Dispense  Refill  . acetaminophen (TYLENOL) 500 MG tablet   Oral   Take  500 mg by mouth every 6 (six) hours as needed for pain.         Marland Kitchen aspirin 325 MG tablet   Oral   Take 325 mg by mouth daily.         . Aspirin-Acetaminophen-Caffeine (EXCEDRIN PO)   Oral   Take 1 tablet by mouth every 6 (six) hours as needed (pain).         . cholecalciferol (VITAMIN D) 1000 UNITS tablet   Oral   Take 1,000 Units by mouth daily.         Marland Kitchen estradiol-norethindrone (ACTIVELLA) 1-0.5 MG per tablet   Oral   Take 1 tablet by mouth daily.         . hydrochlorothiazide (HYDRODIURIL) 12.5 MG tablet   Oral   Take 12.5 mg by mouth daily.         Marland Kitchen losartan (COZAAR) 50 MG tablet   Oral   Take 50 mg by mouth daily.         . Multiple Vitamins-Minerals (MULTIVITAMINS THER. W/MINERALS) TABS   Oral   Take 1 tablet by mouth daily.         Marland Kitchen b complex vitamins tablet   Oral   Take 1 tablet by mouth daily.         Marland Kitchen  cephALEXin (KEFLEX) 500 MG capsule   Oral   Take 1 capsule (500 mg total) by mouth 2 (two) times daily.   14 capsule   0   . ondansetron (ZOFRAN-ODT) 4 MG disintegrating tablet   Oral   Take 1 tablet (4 mg total) by mouth every 12 (twelve) hours as needed for nausea.   10 tablet   0   . traMADol (ULTRAM) 50 MG tablet   Oral   Take 1 tablet (50 mg total) by mouth every 6 (six) hours as needed for pain.   15 tablet   0    BP 113/81  Pulse 160  Temp(Src) 98.6 F (37 C) (Oral)  Resp 20  Ht 5\' 7"  (1.702 m)  Wt 225 lb (102.059 kg)  BMI 35.23 kg/m2  SpO2 97% Physical Exam  Nursing note and vitals reviewed. Constitutional: She appears well-developed and well-nourished.  Awake, alert, nontoxic appearance.  HENT:  Head: Normocephalic and atraumatic.  Eyes: EOM are normal. Pupils are equal, round, and reactive to light.  Neck: Neck supple.  Cardiovascular:  SVT rate 157  Pulmonary/Chest: Effort normal and breath sounds normal. She exhibits no tenderness.  Abdominal: Soft. Bowel sounds are normal. There is no tenderness. There  is no rebound.  Musculoskeletal: She exhibits no tenderness.  Baseline ROM, no obvious new focal weakness.  Neurological:  Mental status and motor strength appears baseline for patient and situation.  Skin: No rash noted.  Psychiatric: She has a normal mood and affect.    ED Course  Procedures (including critical care time) Results for orders placed during the hospital encounter of 04/18/13  CBC WITH DIFFERENTIAL      Result Value Range   WBC 4.3  4.0 - 10.5 K/uL   RBC 3.82 (*) 3.87 - 5.11 MIL/uL   Hemoglobin 11.5 (*) 12.0 - 15.0 g/dL   HCT 16.1 (*) 09.6 - 04.5 %   MCV 86.4  78.0 - 100.0 fL   MCH 30.1  26.0 - 34.0 pg   MCHC 34.8  30.0 - 36.0 g/dL   RDW 40.9  81.1 - 91.4 %   Platelets 194  150 - 400 K/uL   Neutrophils Relative % 55  43 - 77 %   Neutro Abs 2.3  1.7 - 7.7 K/uL   Lymphocytes Relative 32  12 - 46 %   Lymphs Abs 1.4  0.7 - 4.0 K/uL   Monocytes Relative 9  3 - 12 %   Monocytes Absolute 0.4  0.1 - 1.0 K/uL   Eosinophils Relative 4  0 - 5 %   Eosinophils Absolute 0.2  0.0 - 0.7 K/uL   Basophils Relative 1  0 - 1 %   Basophils Absolute 0.0  0.0 - 0.1 K/uL  BASIC METABOLIC PANEL      Result Value Range   Sodium 138  135 - 145 mEq/L   Potassium 3.8  3.5 - 5.1 mEq/L   Chloride 104  96 - 112 mEq/L   CO2 23  19 - 32 mEq/L   Glucose, Bld 119 (*) 70 - 99 mg/dL   BUN 16  6 - 23 mg/dL   Creatinine, Ser 7.82  0.50 - 1.10 mg/dL   Calcium 8.8  8.4 - 95.6 mg/dL   GFR calc non Af Amer 76 (*) >90 mL/min   GFR calc Af Amer 88 (*) >90 mL/min  TROPONIN I      Result Value Range   Troponin I <0.30  <0.30 ng/mL  Dg Chest Port 1 View  04/18/2013   *RADIOLOGY REPORT*  Clinical Data: Short of breath.  PORTABLE CHEST - 1 VIEW  Comparison: 02/09/2013.  Findings: Cardiopericardial silhouette appears within normal limits.  Radiopaque apparatus projects over the heart, likely representing either defibrillator pads or external pacemaker apparatus.  One of these projects over the  descending thoracic aorta.  There is no airspace disease.  No pleural effusion. Monitoring leads are projected over the chest.  IMPRESSION: No active cardiopulmonary disease.   Original Report Authenticated By: Andreas Newport, M.D.     Date: 04/18/2013 1610  Rate:157  Rhythm: supraventricular tachycardia (SVT)  QRS Axis: normal  Intervals: normal  ST/T Wave abnormalities: normal  Conduction Disutrbances:none  Narrative Interpretation:  Old EKG Reviewed:same as 02/10/13 when she was in SVT  Given adenosine 6 mg, 12 mg   Date: 04/18/2013    9604  Rate: 82  Rhythm: normal sinus rhythm  QRS Axis: normal  Intervals: normal  ST/T Wave abnormalities: nonspecific T wave changes  Conduction Disutrbances:none  Narrative Interpretation:   Old EKG Reviewed: rate down to 82 and NSR c/w earlier EKG    MDM  Patient presents in SVT with a rate of 157. Given adenosine 6 mg with no effect. Given adenosine 12 mg with resultant conversion to NSR, rate 82. Given cardizem 180 mg PO. Patient will follow up with Dr. Juanetta Gosling. Rx for metoprolol. The patient appears reasonably screened and/or stabilized for discharge and I doubt any other medical condition or other [facility] requiring further screening, evaluation, or treatment in the ED at this time prior to discharge.   CRITICAL CARE Performed by: Annamarie Dawley. Total critical care time: 30 Critical care time was exclusive of separately billable procedures and treating other patients. Critical care was necessary to treat or prevent imminent or life-threatening deterioration. Critical care was time spent personally by me on the following activities: development of treatment plan with patient and/or surrogate as well as nursing, discussions with consultants, evaluation of patient's response to treatment, examination of patient, obtaining history from patient or surrogate, ordering and performing treatments and interventions, ordering and review of laboratory  studies, ordering and review of radiographic studies, pulse oximetry and re-evaluation of patient's condition.  MDM Reviewed: nursing note and vitals Interpretation: labs, ECG and x-ray     Nicoletta Dress. Colon Branch, MD 04/18/13 [phone]

## 2013-04-18 NOTE — ED Provider Notes (Signed)
1610   Date: 04/18/2013    9604  Rate:157  Rhythm: supraventricular tachycardia (SVT)  QRS Axis: normal  Intervals: normal  ST/T Wave abnormalities: normal  Conduction Disutrbances:none  Narrative Interpretation:   Old EKG Reviewed:same as 02/10/13 when she was in SVT     [facility]. Colon Branch, MD 04/18/13 [phone]

## 2013-04-18 NOTE — ED Notes (Signed)
Patient c/o palpitations; states has history of SVT.  C/o mild shortness of breath.

## 2013-10-04 ENCOUNTER — Encounter: Payer: 59 | Admitting: Cardiology

## 2013-10-04 ENCOUNTER — Encounter: Payer: Self-pay | Admitting: Cardiology

## 2013-10-04 NOTE — Progress Notes (Signed)
Clinical Summary [name] is a 57 y.o.female seen today as a new patient. She was seen for the following medical problems.  1. SVT - prior ER visit 04/2013 with palpitations that awoke her from sleep. She had prior presentation with SVT wither termination with adensoine 01/2013.  - in ER heart rate 150s with stable blood pressure. Given 6mg  adenosine then 12mg  with SVT breaking, started on metoprolol as an outpatient.   Past Medical History  Diagnosis Date  . Chest pain   . Palpitations   . Hypertension   . SVT (supraventricular tachycardia)      Allergies  Allergen Reactions  . Lodine [Etodolac]      Current Outpatient Prescriptions  Medication Sig Dispense Refill  . acetaminophen (TYLENOL) 500 MG tablet Take 500 mg by mouth every 6 (six) hours as needed for pain.      Marland Kitchen aspirin 325 MG tablet Take 325 mg by mouth daily.      . Aspirin-Acetaminophen-Caffeine (EXCEDRIN PO) Take 1 tablet by mouth every 6 (six) hours as needed (pain).      Marland Kitchen b complex vitamins tablet Take 1 tablet by mouth daily.      . cephALEXin (KEFLEX) 500 MG capsule Take 1 capsule (500 mg total) by mouth 2 (two) times daily.  14 capsule  0  . cholecalciferol (VITAMIN D) 1000 UNITS tablet Take 1,000 Units by mouth daily.      Marland Kitchen estradiol-norethindrone (ACTIVELLA) 1-0.5 MG per tablet Take 1 tablet by mouth daily.      . hydrochlorothiazide (HYDRODIURIL) 12.5 MG tablet Take 12.5 mg by mouth daily.      Marland Kitchen losartan (COZAAR) 50 MG tablet Take 50 mg by mouth daily.      . metoprolol (LOPRESSOR) 25 MG tablet Take one tablet daily  30 tablet  0  . Multiple Vitamins-Minerals (MULTIVITAMINS THER. W/MINERALS) TABS Take 1 tablet by mouth daily.      . ondansetron (ZOFRAN-ODT) 4 MG disintegrating tablet Take 1 tablet (4 mg total) by mouth every 12 (twelve) hours as needed for nausea.  10 tablet  0  . traMADol (ULTRAM) 50 MG tablet Take 1 tablet (50 mg total) by mouth every 6 (six) hours as needed for pain.  15 tablet   0   No current facility-administered medications for this visit.     Past Surgical History  Procedure Laterality Date  . Tubal ligation       Allergies  Allergen Reactions  . Lodine [Etodolac]       Family History  Problem Relation Age of Onset  . Diabetes Other   . COPD Other   . Hypertension Other   . Heart disease Other      Social History Ms. Mallery reports that she has never smoked. She does not have any smokeless tobacco history on file. Ms. Grout reports that she does not drink alcohol.   Review of Systems CONSTITUTIONAL: No weight loss, fever, chills, weakness or fatigue.  HEENT: Eyes: No visual loss, blurred vision, double vision or yellow sclerae.No hearing loss, sneezing, congestion, runny nose or sore throat.  SKIN: No rash or itching.  CARDIOVASCULAR:  RESPIRATORY: No shortness of breath, cough or sputum.  GASTROINTESTINAL: No anorexia, nausea, vomiting or diarrhea. No abdominal pain or blood.  GENITOURINARY: No burning on urination, no polyuria NEUROLOGICAL: No headache, dizziness, syncope, paralysis, ataxia, numbness or tingling in the extremities. No change in bowel or bladder control.  MUSCULOSKELETAL: No muscle, back pain, joint pain or  stiffness.  LYMPHATICS: No enlarged nodes. No history of splenectomy.  PSYCHIATRIC: No history of depression or anxiety.  ENDOCRINOLOGIC: No reports of sweating, cold or heat intolerance. No polyuria or polydipsia.  Marland Kitchen   Physical Examination There were no vitals filed for this visit. There were no vitals filed for this visit.  Gen: resting comfortably, no acute distress HEENT: no scleral icterus, pupils equal round and reactive, no palptable cervical adenopathy,  CV Resp: Clear to auscultation bilaterally GI: abdomen is soft, non-tender, non-distended, normal bowel sounds, no hepatosplenomegaly MSK: extremities are warm, no edema.  Skin: warm, no rash Neuro:  no focal deficits Psych: appropriate  affect   Diagnostic Studies 04/2013 EKG: narrow complex regular tachycardia rate 160     Assessment and Plan  1. SVT - given prior responses to adenosine consistent with reentry tachycardia. Her baseline EKG shows no evidence of preexcitation TSH      Antoine Poche, M.D., F.A.C.C.

## 2013-11-22 ENCOUNTER — Ambulatory Visit (INDEPENDENT_AMBULATORY_CARE_PROVIDER_SITE_OTHER): Payer: 59 | Admitting: General Surgery

## 2013-12-20 ENCOUNTER — Encounter (INDEPENDENT_AMBULATORY_CARE_PROVIDER_SITE_OTHER): Payer: Self-pay | Admitting: General Surgery

## 2013-12-20 ENCOUNTER — Telehealth (INDEPENDENT_AMBULATORY_CARE_PROVIDER_SITE_OTHER): Payer: Self-pay | Admitting: General Surgery

## 2013-12-20 ENCOUNTER — Ambulatory Visit (INDEPENDENT_AMBULATORY_CARE_PROVIDER_SITE_OTHER): Payer: 59 | Admitting: General Surgery

## 2013-12-20 VITALS — BP 124/82 | HR 67 | Temp 98.4°F | Resp 16 | Ht 67.0 in | Wt 232.0 lb

## 2013-12-20 DIAGNOSIS — N6009 Solitary cyst of unspecified breast: Secondary | ICD-10-CM

## 2013-12-20 DIAGNOSIS — N6002 Solitary cyst of left breast: Secondary | ICD-10-CM | POA: Insufficient documentation

## 2013-12-20 NOTE — Addendum Note (Signed)
Addended by: Liz MaladyHOMPSON, Shaylynne Lunt E on: 12/20/2013 11:57 AM   Modules accepted: Orders

## 2013-12-20 NOTE — Progress Notes (Signed)
Patient ID: [name], female   DOB: 12/09/1955, 58 y.o.   MRN: [id number]  Chief Complaint  Patient presents with  . cyst, lt breast    new pt    HPI [name] is a 58 y.o. female.  Chief complaint: Left breast cyst HPI Patient is well known to be status post excision left breast mass in 2010. This was a cyst with fibrocystic changes. She has had issues with recurrence of cysts in her left breast. They have been drained in the past but tend to recur. She has an area from 1 to 2:00 on her left breast which is intermittently uncomfortable. She underwent mammography at [facility] In September of 2014 which showed no mammographic evidence of malignancy. Dr. Rana Snare asked me to see her in consultation regarding excision of this area of her left breast which is symptomatic. Past Medical History  Diagnosis Date  . Chest pain   . Palpitations   . Hypertension   . SVT (supraventricular tachycardia)     Past Surgical History  Procedure Laterality Date  . Tubal ligation      Family History  Problem Relation Age of Onset  . Diabetes Other   . COPD Other   . Hypertension Other   . Heart disease Other     Social History History  Substance Use Topics  . Smoking status: Never Smoker   . Smokeless tobacco: Not on file  . Alcohol Use: No    Allergies  Allergen Reactions  . Lodine [Etodolac]     Current Outpatient Prescriptions  Medication Sig Dispense Refill  . acetaminophen (TYLENOL) 500 MG tablet Take 500 mg by mouth every 6 (six) hours as needed for pain.      Marland Kitchen aspirin 325 MG tablet Take 325 mg by mouth daily.      . Aspirin-Acetaminophen-Caffeine (EXCEDRIN PO) Take 1 tablet by mouth every 6 (six) hours as needed (pain).      Marland Kitchen b complex vitamins tablet Take 1 tablet by mouth daily.      . cholecalciferol (VITAMIN D) 1000 UNITS tablet Take 1,000 Units by mouth daily.      Marland Kitchen estradiol-norethindrone (ACTIVELLA) 1-0.5 MG per tablet Take 1 tablet by mouth  daily.      . hydrochlorothiazide (HYDRODIURIL) 12.5 MG tablet Take 12.5 mg by mouth daily.      Marland Kitchen losartan (COZAAR) 50 MG tablet Take 50 mg by mouth daily.      . metoprolol (LOPRESSOR) 25 MG tablet Take one tablet daily  30 tablet  0  . cephALEXin (KEFLEX) 500 MG capsule Take 1 capsule (500 mg total) by mouth 2 (two) times daily.  14 capsule  0   No current facility-administered medications for this visit.    Review of Systems Review of Systems  Constitutional: Negative for fever, chills and unexpected weight change.  HENT: Negative for congestion, hearing loss, sore throat, trouble swallowing and voice change.   Eyes: Negative for visual disturbance.  Respiratory: Negative for cough and wheezing.   Cardiovascular: Negative for chest pain, palpitations and leg swelling.       Regarding breast please see the history of present illness  Gastrointestinal: Negative for nausea, vomiting, abdominal pain, diarrhea, constipation, blood in stool, abdominal distention and anal bleeding.  Genitourinary: Negative for hematuria, vaginal bleeding and difficulty urinating.  Musculoskeletal: Negative for arthralgias.  Skin: Negative for rash and wound.  Neurological: Negative for seizures, syncope and headaches.  Hematological: Negative for adenopathy. Does  not bruise/bleed easily.  Psychiatric/Behavioral: Negative for confusion.    Blood pressure 124/82, pulse 67, temperature 98.4 F (36.9 C), temperature source Oral, resp. rate 16, height 5\' 7"  (1.702 m), weight 232 lb (105.235 kg).  Physical Exam Physical Exam  Constitutional: She is oriented to person, place, and time. She appears well-developed and well-nourished. No distress.  HENT:  Head: Normocephalic and atraumatic.  Eyes: EOM are normal. Pupils are equal, round, and reactive to light.  Neck: Normal range of motion. Neck supple. No tracheal deviation present.  Cardiovascular: Normal rate, regular rhythm and normal heart sounds.   No  murmur heard. Pulmonary/Chest: Effort normal and breath sounds normal. No stridor. No respiratory distress. She has no wheezes. She has no rales.  Abdominal: Soft. Bowel sounds are normal. She exhibits no distension. There is no tenderness. There is no rebound and no guarding.  Musculoskeletal: Normal range of motion. She exhibits no edema and no tenderness.  Lymphadenopathy:    She has no cervical adenopathy.  Neurological: She is alert and oriented to person, place, and time. She exhibits normal muscle tone. Coordination normal.  Skin: Skin is warm and dry.  Breasts: Right breast nipple is normal without discharge, no palpable right breast mass, right axilla without palpable mass or adenopathy, left breast has 2.5 cm mass extending from 1 to 2:00 position 2 cm away from her area with margin, mild tenderness with palpation, no other distinct masses, no axillary mass on the left, no left axillary lymphadenopathy  Data Reviewed Office notes and mammography as above  Assessment    Symptomatic left breast cyst    Plan    I have Offered excision of left breast cyst as an outpatient surgical procedure under general anesthesia. We discussed the procedure, the risks, and the benefits as well as the expected outcome. She is agreeable and we will schedule in the near future. She will hold her aspirin for 5 days prior to surgery.    [name] 12/20/2013, 11:48 AM

## 2013-12-20 NOTE — Telephone Encounter (Signed)
Pt made aware of [facility] financial obligation will call back to schedule orders on file

## 2014-01-10 ENCOUNTER — Encounter (INDEPENDENT_AMBULATORY_CARE_PROVIDER_SITE_OTHER): Payer: Self-pay

## 2014-03-16 NOTE — Progress Notes (Signed)
This encounter was created in error - please disregard.

## 2015-01-23 ENCOUNTER — Other Ambulatory Visit: Payer: Self-pay

## 2015-01-25 LAB — CYTOLOGY - PAP

## 2015-04-07 ENCOUNTER — Emergency Department (HOSPITAL_COMMUNITY)
Admission: EM | Admit: 2015-04-07 | Discharge: 2015-04-07 | Disposition: A | Discharge status: Home / Self Care | Payer: 59 | Attending: Emergency Medicine | Admitting: Emergency Medicine

## 2015-04-07 ENCOUNTER — Emergency Department (HOSPITAL_COMMUNITY): Payer: 59

## 2015-04-07 ENCOUNTER — Encounter (HOSPITAL_COMMUNITY): Payer: Self-pay | Admitting: *Deleted

## 2015-04-07 DIAGNOSIS — Z7982 Long term (current) use of aspirin: Secondary | ICD-10-CM | POA: Insufficient documentation

## 2015-04-07 DIAGNOSIS — Z792 Long term (current) use of antibiotics: Secondary | ICD-10-CM | POA: Insufficient documentation

## 2015-04-07 DIAGNOSIS — Y9289 Other specified places as the place of occurrence of the external cause: Secondary | ICD-10-CM | POA: Diagnosis not present

## 2015-04-07 DIAGNOSIS — S82432A Displaced oblique fracture of shaft of left fibula, initial encounter for closed fracture: Secondary | ICD-10-CM | POA: Insufficient documentation

## 2015-04-07 DIAGNOSIS — I10 Essential (primary) hypertension: Secondary | ICD-10-CM | POA: Insufficient documentation

## 2015-04-07 DIAGNOSIS — S8992XA Unspecified injury of left lower leg, initial encounter: Secondary | ICD-10-CM | POA: Insufficient documentation

## 2015-04-07 DIAGNOSIS — S99912A Unspecified injury of left ankle, initial encounter: Secondary | ICD-10-CM | POA: Diagnosis present

## 2015-04-07 DIAGNOSIS — I471 Supraventricular tachycardia: Secondary | ICD-10-CM | POA: Diagnosis not present

## 2015-04-07 DIAGNOSIS — Y998 Other external cause status: Secondary | ICD-10-CM | POA: Diagnosis not present

## 2015-04-07 DIAGNOSIS — W1839XA Other fall on same level, initial encounter: Secondary | ICD-10-CM | POA: Insufficient documentation

## 2015-04-07 DIAGNOSIS — Z79899 Other long term (current) drug therapy: Secondary | ICD-10-CM | POA: Diagnosis not present

## 2015-04-07 DIAGNOSIS — S82402A Unspecified fracture of shaft of left fibula, initial encounter for closed fracture: Secondary | ICD-10-CM

## 2015-04-07 DIAGNOSIS — Y9389 Activity, other specified: Secondary | ICD-10-CM | POA: Insufficient documentation

## 2015-04-07 DIAGNOSIS — Z793 Long term (current) use of hormonal contraceptives: Secondary | ICD-10-CM | POA: Insufficient documentation

## 2015-04-07 MED ORDER — HYDROCODONE-ACETAMINOPHEN 5-325 MG PO TABS: 1.0000 | ORAL_TABLET | ORAL | Status: DC | PRN

## 2015-04-07 NOTE — ED Provider Notes (Signed)
CSN: [id number]     Arrival date & time 04/07/15  1713 History  This chart was scribed for non-physician practitioner, Burgess Amor, PA-C, working with Mancel Bale, MD, by Budd Palmer ED Scribe. This patient was seen in room APFT23/APFT23 and the patient's care was started at 5:37 PM   Chief Complaint  Patient presents with  . Knee Pain   Patient is a 59 y.o. female presenting with knee pain. The history is provided by the patient. No language interpreter was used.  Knee Pain Location:  Ankle and knee Time since incident:  2 days Knee location:  L knee Ankle location:  L ankle Pain details:    Quality:  Aching   Radiates to:  Does not radiate   Severity:  Moderate   Onset quality:  Sudden   Timing:  Constant   Progression:  Worsening Chronicity:  New Dislocation: no   Foreign body present:  No foreign bodies Prior injury to area:  No Relieved by:  NSAIDs, ice and elevation Associated symptoms: decreased ROM and swelling   Associated symptoms: no fever    HPI Comments: [name] is a 59 y.o. female with a PMHx of hypertension, who presents to the Emergency Department complaining of constant, gradually worsening left knee and ankle pain onset 2 days ago. Patient reports associated swelling and ecchymosis to left ankle. She states that she was walking down a slight slope and slipped in some mud, bending her left leg back, twisting her body as she fell onto some roots and grass. She is able to wiggle her toes. She reports pain in her left knee and ankle when bearing weight. She is able to ambulate. She has applied ice packs, and elevated the leg, which reduced the swelling. She has been taking  ibuprofen for pain, last dose at 11AM with moderate relief. She denies hx of similar injuries. 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  Past Medical History  Diagnosis Date  . Chest pain   . Palpitations   . Hypertension   . SVT (supraventricular tachycardia)    Past Surgical  History  Procedure Laterality Date  . Tubal ligation     Family History  Problem Relation Age of Onset  . Diabetes Other   . COPD Other   . Hypertension Other   . Heart disease Other    History  Substance Use Topics  . Smoking status: Never Smoker   . Smokeless tobacco: Not on file  . Alcohol Use: No   OB History    No data available     Review of Systems  Constitutional: Negative for fever.  Musculoskeletal: Positive for joint swelling and arthralgias. Negative for myalgias.  Skin: Positive for color change (Ecchymosis).  Neurological: Negative for weakness and numbness.   Allergies  Lodine  Home Medications   Prior to Admission medications   Medication Sig Start Date End Date Taking? Authorizing Provider  aspirin 325 MG tablet Take 325 mg by mouth daily.   Yes Historical Provider, MD  Aspirin-Acetaminophen-Caffeine (EXCEDRIN PO) Take 1 tablet by mouth every 6 (six) hours as needed (pain).   Yes Historical Provider, MD  cholecalciferol (VITAMIN D) 1000 UNITS tablet Take 1,000 Units by mouth daily.   Yes Historical Provider, MD  estradiol-norethindrone (ACTIVELLA) 1-0.5 MG per tablet Take 1 tablet by mouth daily.   Yes Historical Provider, MD  hydrochlorothiazide (HYDRODIURIL) 12.5 MG tablet Take 12.5 mg by mouth daily.   Yes Historical Provider, MD  ibuprofen (ADVIL,MOTRIN) 200  MG tablet Take 800 mg by mouth every 6 (six) hours as needed for mild pain.   Yes Historical Provider, MD  losartan (COZAAR) 50 MG tablet Take 50 mg by mouth daily.   Yes Historical Provider, MD  metoprolol (LOPRESSOR) 25 MG tablet Take one tablet daily 04/18/13  Yes Annamarie Dawley, MD  Multiple Vitamin (DAILY VITE PO) Take 1 tablet by mouth daily.   Yes Historical Provider, MD  Omega-3 Fatty Acids (FISH OIL) 1000 MG CAPS Take 1,000 mg by mouth daily.   Yes Historical Provider, MD  Propylene Glycol (SYSTANE BALANCE) 0.6 % SOLN Place 1 drop into both eyes 2 (two) times daily.   Yes Historical  Provider, MD  cephALEXin (KEFLEX) 500 MG capsule Take 1 capsule (500 mg total) by mouth 2 (two) times daily. 04/09/13   Adlih Moreno-Coll, MD  HYDROcodone-acetaminophen (NORCO/VICODIN) 5-325 MG per tablet Take 1 tablet by mouth every 4 (four) hours as needed. 04/07/15   Burgess Amor, PA-C   Triage Vitals: BP 116/77 mmHg  Pulse 80  Temp(Src) 98.7 F (37.1 C) (Oral)  Resp 20  Ht  (1.702 m)  Wt 225 lb (102.059 kg)  BMI 35.23 kg/m2  SpO2 100%  Physical Exam  Constitutional: She appears well-developed and well-nourished.  HENT:  Head: Atraumatic.  Neck: Normal range of motion.  Cardiovascular:  Pulses equal bilaterally  Musculoskeletal: She exhibits tenderness.       Left knee: She exhibits no swelling, no effusion, no ecchymosis, no deformity, no LCL laxity and no MCL laxity. Tenderness found. Medial joint line tenderness noted.       Left ankle: She exhibits decreased range of motion, swelling and ecchymosis. She exhibits no deformity and normal pulse. Tenderness. Lateral malleolus tenderness found. No head of 5th metatarsal and no proximal fibula tenderness found. Achilles tendon normal.  Full DP pulse  Neurological: She is alert. She has normal strength. She displays normal reflexes. No sensory deficit.  Skin: Skin is warm and dry.  Psychiatric: She has a normal mood and affect.   ED Course  Procedures (including critical care time) DIAGNOSTIC STUDIES: Oxygen Saturation is 100% on RA, normal by my interpretation.    COORDINATION OF CARE: 5:41 PM - Discussed plans to order diagnostic imaging of left knee and left ankle. Will order crutches and ASO brace. Advised pt to follow up with PCP in 8-10 days and physical therapy as needed. Pt advised of plan for treatment and pt agrees.  Labs Review Labs Reviewed - No data to display  Imaging Review Dg Ankle Complete Left  04/07/2015   CLINICAL DATA:  Left ankle pain, trauma  EXAM: LEFT ANKLE COMPLETE - 3+ VIEW  COMPARISON:  None.   FINDINGS: There is an oblique distal fibular fracture with associated lateral malleolar soft tissue swelling. Asymmetric widening of the mortise medially is identified which may indicate ligamentous injury. Plantar calcaneal spurring. There is a tiny avulsion fracture off the medial malleolus inferiorly.  IMPRESSION: Oblique distal fibular fracture with widening of the medial ankle mortise.  Tiny avulsion fracture off the medial malleolus.   Electronically Signed   By: Christiana Pellant M.D.   On: 04/07/2015 17:49   Dg Knee Complete 4 Views Left  04/07/2015   CLINICAL DATA:  Hyperextension injury left knee 04/05/2015. Left knee pain. Initial encounter.  EXAM: LEFT KNEE - COMPLETE 4+ VIEW  COMPARISON:  None.  FINDINGS: There is no acute bony or joint abnormality. No joint effusion is seen. Mild appearing degenerative  disease is noted about the knee. No focal bony lesion is identified.  IMPRESSION: No acute abnormality.  Mild osteoarthritis.   Electronically Signed   By: Drusilla Kanner M.D.   On: 04/07/2015 17:42     EKG Interpretation None     MDM   Final diagnoses:  Fibula fracture, left, closed, initial encounter    Patients labs and/or radiological studies were reviewed and considered during the medical decision making and disposition process.  Results were also discussed with patient. Pt was placed in posterior splint, stirrups, crutches given, although she had difficulty using, prescription for walker given as well.  She was referred to ortho for close f/u care - pt to call in am for appt time, discussed surgical nature of injury.  She has requested Dr. Lequita Halt as her husband is a patient of his - info given along with Dr. Audrie Lia info in event Dr Lequita Halt is not available.  Encouraged ice,  Elevation, non weight bearing.  Hydrocodone prescribed.  I personally performed the services described in this documentation, which was scribed in my presence. The recorded information has been reviewed and  is accurate.   Burgess Amor, PA-C 04/07/15 1913  Mancel Bale, MD 04/08/15 [phone]

## 2015-04-07 NOTE — ED Notes (Signed)
Pt slide in mud on Friday and had left leg bent back, c/o left knee and left ankle pain, pt able to ambulate in triage room

## 2015-04-07 NOTE — Discharge Instructions (Signed)
Cast or Splint Care °Casts and splints support injured limbs and keep bones from moving while they heal. It is important to care for your cast or splint at home.   °HOME CARE INSTRUCTIONS °· Keep the cast or splint uncovered during the drying period. It can take 24 to 48 hours to dry if it is made of plaster. A fiberglass cast will dry in less than 1 hour. °· Do not rest the cast on anything harder than a pillow for the first 24 hours. °· Do not put weight on your injured limb or apply pressure to the cast until your health care provider gives you permission. °· Keep the cast or splint dry. Wet casts or splints can lose their shape and may not support the limb as well. A wet cast that has lost its shape can also create harmful pressure on your skin when it dries. Also, wet skin can become infected. °¨ Cover the cast or splint with a plastic bag when bathing or when out in the rain or snow. If the cast is on the trunk of the body, take sponge baths until the cast is removed. °¨ If your cast does become wet, dry it with a towel or a blow dryer on the cool setting only. °· Keep your cast or splint clean. Soiled casts may be wiped with a moistened cloth. °· Do not place any hard or soft foreign objects under your cast or splint, such as cotton, toilet paper, lotion, or powder. °· Do not try to scratch the skin under the cast with any object. The object could get stuck inside the cast. Also, scratching could lead to an infection. If itching is a problem, use a blow dryer on a cool setting to relieve discomfort. °· Do not trim or cut your cast or remove padding from inside of it. °· Exercise all joints next to the injury that are not immobilized by the cast or splint. For example, if you have a long leg cast, exercise the hip joint and toes. If you have an arm cast or splint, exercise the shoulder, elbow, thumb, and fingers. °· Elevate your injured arm or leg on 1 or 2 pillows for the first 1 to 3 days to decrease  swelling and pain. It is best if you can comfortably elevate your cast so it is higher than your heart. °SEEK MEDICAL CARE IF:  °· Your cast or splint cracks. °· Your cast or splint is too tight or too loose. °· You have unbearable itching inside the cast. °· Your cast becomes wet or develops a soft spot or area. °· You have a bad smell coming from inside your cast. °· You get an object stuck under your cast. °· Your skin around the cast becomes red or raw. °· You have new pain or worsening pain after the cast has been applied. °SEEK IMMEDIATE MEDICAL CARE IF:  °· You have fluid leaking through the cast. °· You are unable to move your fingers or toes. °· You have discolored (blue or white), cool, painful, or very swollen fingers or toes beyond the cast. °· You have tingling or numbness around the injured area. °· You have severe pain or pressure under the cast. °· You have any difficulty with your breathing or have shortness of breath. °· You have chest pain. °Document Released: 10/02/2000 Document Revised: 07/26/2013 Document Reviewed: 04/13/2013 °ExitCare® Patient Information ©2015 [employer]. This information is not intended to replace advice given to you by your health care   provider. Make sure you discuss any questions you have with your health care provider. ° °Fibular Fracture, Ankle, Adult, Undisplaced, Treated With Immobilization °A simple fracture of the bone below the knee on the outside of your leg (fibula) usually heals without problems. °CAUSES °Typically, a fibular fracture occurs as a result of trauma. A blow to the side of your leg or a powerful twisting movement can cause a fracture. Fibular fractures are often seen as a result of football, soccer, or skiing injuries. °SYMPTOMS °Symptoms of a fibular fracture can include: °· Pain. °· Shortening or abnormal alignment of your lower leg (angulation). °DIAGNOSIS °A health care provider will need to examine the leg. X-ray exams will be ordered for  further to confirm the fracture and evaluate the extent and of the injury. °TREATMENT  °Typically, a cast or immobilizer is applied. Sometimes a splint is placed on these fractures if it is needed for comfort or if the bones are badly out of place. Crutches may be needed to help you get around.  °HOME CARE INSTRUCTIONS  °· Apply ice to the injured area: °¨ Put ice in a plastic bag. °¨ Place a towel between your skin and the bag. °¨ Leave the ice on for 20 minutes, 2-3 times a day. °· Use crutches as directed. Resume walking without crutches as directed by your health care provider or when comfortable doing so. °· Only take over-the-counter or prescription medicines for pain, discomfort, or fever as directed by your health care provider. °· Keeping your leg raised may lessen swelling. °· If you have a removable splint or boot, do not remove the boot unless directed by your health care provider. °· Do not not drive a car or operate a motor vehicle until your health care provider specifically tells you it is safe to do so. °SEEK IMMEDIATE MEDICAL CARE IF:  °· Your cast gets damaged or breaks. °· You have continued severe pain or more swelling than you did before the cast was put on, or the pain is not controlled with medications. °· Your skin or nails below the injury turn blue or grey, or feel cold or numb. °· There is a bad smell or pus coming from under the cast. °· You develop severe pain in ankle or foot. °MAKE SURE YOU:  °· Understand these instructions. °· Will watch your condition. °· Will get help right away if you are not doing well or get worse. °Document Released: 06/27/2002 Document Revised: 07/26/2013 Document Reviewed: 05/17/2013 °ExitCare® Patient Information ©2015 [employer]. This information is not intended to replace advice given to you by your health care provider. Make sure you discuss any questions you have with your health care provider. ° °

## 2015-05-31 ENCOUNTER — Ambulatory Visit (HOSPITAL_COMMUNITY): Payer: 59 | Attending: Sports Medicine | Admitting: Physical Therapy

## 2015-05-31 DIAGNOSIS — R29898 Other symptoms and signs involving the musculoskeletal system: Secondary | ICD-10-CM | POA: Diagnosis not present

## 2015-05-31 DIAGNOSIS — M25472 Effusion, left ankle: Secondary | ICD-10-CM | POA: Diagnosis not present

## 2015-05-31 DIAGNOSIS — R262 Difficulty in walking, not elsewhere classified: Secondary | ICD-10-CM | POA: Diagnosis not present

## 2015-05-31 DIAGNOSIS — S82402D Unspecified fracture of shaft of left fibula, subsequent encounter for closed fracture with routine healing: Secondary | ICD-10-CM | POA: Diagnosis present

## 2015-05-31 DIAGNOSIS — X58XXXD Exposure to other specified factors, subsequent encounter: Secondary | ICD-10-CM | POA: Diagnosis not present

## 2015-05-31 DIAGNOSIS — M25672 Stiffness of left ankle, not elsewhere classified: Secondary | ICD-10-CM | POA: Diagnosis not present

## 2015-05-31 NOTE — Therapy (Signed)
[facility] [address] [place], [place], [address] Phone: [phone]   Fax:  [phone]  Physical Therapy Evaluation  Patient Details  Name: [name] MRN: [id number] Date of Birth: 1956/03/17 Referring Provider:  Delton See, MD  Encounter Date: 05/31/2015      PT End of Session - 05/31/15 1645    Visit Number 1   Number of Visits 10   Date for PT Re-Evaluation 06/28/15   Authorization Type [employer]   Authorization Time Period 05/31/15-07/26/15   PT Start Time 1515   PT Stop Time 1602   PT Time Calculation (min) 47 min   Activity Tolerance Patient tolerated treatment well   Behavior During Therapy [facility] for tasks assessed/performed      Past Medical History  Diagnosis Date  . Chest pain   . Palpitations   . Hypertension   . SVT (supraventricular tachycardia)     Past Surgical History  Procedure Laterality Date  . Tubal ligation      There were no vitals filed for this visit.  Visit Diagnosis:  Fracture of left fibula, closed, with routine healing, subsequent encounter  Difficulty walking  Stiffness of left ankle joint  Left ankle swelling  Weakness of left lower extremity      Subjective Assessment - 05/31/15 1522    Subjective Pt reports that she fell on June 17th while walking in her backyard, presented to ED on Sunday June 19th, where it was discovered that she fractured her fibula. She has been in a boot for about 3 and a half weeks now, and has been using a standard walker for the past 2 weeks. Her MD told her 2 weeks ago that she could begin weight bearing. Pt reports that she has been having difficulty with walking, ascending/descending steps, and completing housework.    How long can you sit comfortably? no limits   How long can you stand comfortably? 10 minutes   How long can you walk comfortably? 5-10 minutes   Patient Stated Goals Be able to walk normally, increase WB on foot, return to PLOF   Currently in Pain? No/denies            [facility] PT Assessment - 05/31/15 0001    Assessment   Medical Diagnosis L fibula fracture   Next MD Visit 06/28/15   Prior Therapy No   Restrictions   Weight Bearing Restrictions Yes   LLE Weight Bearing Weight bearing as tolerated   Other Position/Activity Restrictions WBAT in boot   Balance Screen   Has the patient fallen in the past 6 months Yes   How many times? 1   Has the patient had a decrease in activity level because of a fear of falling?  No   Is the patient reluctant to leave their home because of a fear of falling?  No   Home Environment   Living Environment Private residence   Living Arrangements Spouse/significant other   Type of Home House   Home Layout Two level;Bed/bath upstairs   Alternate Level Stairs-Number of Steps 7   Alternate Level Stairs-Rails Right   Prior Function   Level of Independence Independent   Vocation Full time employment   Vocation Requirements works in office   Observation/Other Assessments   Focus on Therapeutic Outcomes (FOTO)  66% limitation   ROM / Strength   AROM / PROM / Strength AROM;PROM;Strength   AROM   AROM Assessment Site Ankle   Right/Left Ankle Right;Left  Right Ankle Dorsiflexion 10   Right Ankle Plantar Flexion 45   Right Ankle Inversion 45   Right Ankle Eversion 35   Left Ankle Dorsiflexion -5   Left Ankle Plantar Flexion 30   Left Ankle Inversion 28   Left Ankle Eversion 20   PROM   PROM Assessment Site Ankle   Right/Left Ankle Left   Left Ankle Dorsiflexion -1   Left Ankle Plantar Flexion 35   Strength   Strength Assessment Site Ankle   Right/Left Ankle Right;Left   Right Ankle Dorsiflexion 5/5   Right Ankle Plantar Flexion 5/5   Right Ankle Inversion 5/5   Right Ankle Eversion 5/5   Left Ankle Dorsiflexion 3/5   Left Ankle Plantar Flexion 3/5   Left Ankle Inversion 3/5   Left Ankle Eversion 3/5   Special Tests    Special Tests Swelling Tests   Swelling Tests  other   other   Side --  R: 57 cm, L: 63 cm   Comments Figure 8 circumfrential measurement   Transfers   Five time sit to stand comments  17.61   Ambulation/Gait   Gait velocity TUG: 30.48 seconds   6 minute walk test results    Aerobic Endurance Distance Walked 225   Endurance additional comments 3 minute walk test with RW, 115' with standard walker                   PT Education - 05/31/15 1645    Education provided Yes   Education Details HEP   Person(s) Educated Patient   Methods Explanation;Handout   Comprehension Verbalized understanding          PT Short Term Goals - 05/31/15 1653    PT SHORT TERM GOAL #1   Title Pt will be independent with HEP.    Time 2   Period Weeks   Status New   PT SHORT TERM GOAL #2   Title Improve ankle ROM by 5-10 degrees in all planes to improve gait mechanics.    Time 2   Period Weeks   Status New   PT SHORT TERM GOAL #3   Title Circumfrential measurement (figure 8) of L ankle will decrease by 2 cm to indicate decreased swelling in foot/ankle.   Time 2   Period Weeks   Status New   PT SHORT TERM GOAL #4   Title Pt will ambulate 450 feet during 3 minute walk test to demonstrate improved gait speed and functional mobility.   Time 2   Period Weeks   Status New   PT SHORT TERM GOAL #5   Title Improve ankle strength to 4-/5 to improve gait mechancis.    Time 2   Period Weeks   Status New           PT Long Term Goals - 05/31/15 1657    PT LONG TERM GOAL #1   Title Improve ankle strength to 4+/5 to improve gait mechanics.   Time 4   Period Weeks   Status New   PT LONG TERM GOAL #2   Title Pt will ascend/descend 12 stairs with reciprocal pattern and <2/10 pain.    Time 4   Period Weeks   Status New   PT LONG TERM GOAL #3   Title Pt will ambulate 600 feet without AD during 3 minute walk test to demonstrate improved gait speed and functional mobility.    Time 4   Period Weeks   Status New  PT LONG TERM GOAL  #4   Title Improve ROM of L ankle to be equal to R ankle to improve gait mechanics and ankle mobility.    Time 4   Period Weeks   Status New   PT LONG TERM GOAL #5   Title Figure 8 measurement of L ankle to be less than or equal to 58 cm to demonstrate decreased swelling in foot/ankle.    Time 4   Period Weeks   Status New   Additional Long Term Goals   Additional Long Term Goals Yes   PT LONG TERM GOAL #6   Title Pt will complete TUG in 15 seconds without AD to demonstrate improved gait speed and decreased fall risk.                Plan - 05/31/15 1646    Clinical Impression Statement Pt presents to PT following fracture of L fibula and inversion sprain of L ankle. Pt is 8 weeks out and is WBAT in boot. She currently ambulates with standard walker and reports that she is unable to ambulate with crutches due to increased anxiety. Pt demonstrates impairments in L ankle ROM, strength, functional activity tolerance, and gait. She will benefit from skilled physical therapy in order to address these impairments to return pt to PLOF of independently ambulating in the community without AD, ascending/descending stairs, and completing housework independently. PT called MD office, and documentation from last office visit was sent over stating that pt is WBAT in boot, and that PT is to wean pt from boot and RW. PT will wait for MD clarification before d/c boot.    Pt will benefit from skilled therapeutic intervention in order to improve on the following deficits Abnormal gait;Decreased activity tolerance;Decreased balance;Decreased endurance;Decreased range of motion;Decreased strength;Difficulty walking;Hypomobility   Rehab Potential Good   PT Frequency 2x / week   PT Duration 4 weeks   PT Treatment/Interventions ADLs/Self Care Home Management;Gait training;Stair training;Functional mobility training;Therapeutic activities;Therapeutic exercise;Balance training;Neuromuscular re-education;Manual  techniques;Passive range of motion   PT Next Visit Plan Continue with ankle mobility exercises, begin seated ankle strengthening   Consulted and Agree with Plan of Care Patient         Problem List Patient Active Problem List   Diagnosis Date Noted  . Cyst of left breast 12/20/2013  . PALPITATIONS 04/15/2009  . CHEST PAIN-UNSPECIFIED 04/15/2009    Leona Singleton, PT, DPT [phone] 05/31/2015, 5:06 PM  [facility] [address] [place], [place], [address] Phone: [phone]   Fax:  [phone]

## 2015-05-31 NOTE — Patient Instructions (Signed)
Ankle Alphabet   Using left ankle and foot only, trace the letters of the alphabet. Perform A to Z. Repeat __2__ times per set. Do __1__ sets per session. Do __2-3__ sessions per day.  http://orth.exer.us/16   Copyright  VHI. All rights reserved.  Ankle Circles   Slowly rotate right foot and ankle clockwise then counterclockwise. Gradually increase range of motion. Avoid pain. Circle __15__ times each direction per set. Do __2__ sets per session. Do _2-3___ sessions per day.  http://orth.exer.us/30   Copyright  VHI. All rights reserved.  Towel Curl   Sitting, crimp a towel up with toes of one foot. Relax foot by spreading towel out again. Repeat with other foot. Repeat ___1_ minute. Do _1-2___ sessions per day.  http://gt2.exer.us/412   Copyright  VHI. All rights reserved.  Towel Slide   Sitting, use strong sideward motion with one foot to slide a towel. Smooth towel back out. Repeat with other foot. Repeat __15__ times. Do __1-2__ sessions per day.  http://gt2.exer.us/414   Copyright  VHI. All rights reserved.

## 2015-06-03 ENCOUNTER — Ambulatory Visit (HOSPITAL_COMMUNITY): Payer: 59 | Admitting: Physical Therapy

## 2015-06-03 DIAGNOSIS — M25672 Stiffness of left ankle, not elsewhere classified: Secondary | ICD-10-CM

## 2015-06-03 DIAGNOSIS — S82402D Unspecified fracture of shaft of left fibula, subsequent encounter for closed fracture with routine healing: Secondary | ICD-10-CM | POA: Diagnosis not present

## 2015-06-03 DIAGNOSIS — R29898 Other symptoms and signs involving the musculoskeletal system: Secondary | ICD-10-CM

## 2015-06-03 DIAGNOSIS — M25472 Effusion, left ankle: Secondary | ICD-10-CM

## 2015-06-03 DIAGNOSIS — R262 Difficulty in walking, not elsewhere classified: Secondary | ICD-10-CM

## 2015-06-03 NOTE — Therapy (Signed)
[facility] [address] [place], [place], [address] Phone: [phone]   Fax:  [phone]  Physical Therapy Treatment  Patient Details  Name: [name] MRN: [id number] Date of Birth: November 12, 1955 Referring Provider:  Delton See, MD  Encounter Date: 06/03/2015      PT End of Session - 06/03/15 1729    Visit Number 2   Number of Visits 10   Date for PT Re-Evaluation 06/28/15   Authorization Type UHC   Authorization Time Period 05/31/15-07/26/15   PT Start Time 1648   PT Stop Time 1727   PT Time Calculation (min) 39 min   Activity Tolerance Patient tolerated treatment well   Behavior During Therapy [facility] for tasks assessed/performed      Past Medical History  Diagnosis Date  . Chest pain   . Palpitations   . Hypertension   . SVT (supraventricular tachycardia)     Past Surgical History  Procedure Laterality Date  . Tubal ligation      There were no vitals filed for this visit.  Visit Diagnosis:  Fracture of left fibula, closed, with routine healing, subsequent encounter  Difficulty walking  Stiffness of left ankle joint  Left ankle swelling  Weakness of left lower extremity      Subjective Assessment - 06/03/15 1648    Subjective Patient arrived with a friend and no assistive device, states that she left it at home; not having any pain today.    Currently in Pain? No/denies                         [facility] Adult PT Treatment/Exercise - 06/03/15 0001    Knee/Hip Exercises: Standing   Forward Lunges Both;1 set;10 reps   Forward Lunges Limitations 4 inch box, boot on    Side Lunges Both;1 set;10 reps   Side Lunges Limitations 4 inch box, boot on    Hip Abduction Both;1 set;10 reps   Abduction Limitations boot on    Hip Extension Both;1 set;10 reps   Extension Limitations boot on    Stairs Stair training technique with B handrails and boot on; focued on step to pattern for safety today due  to boot throwing off COM and balance    Rocker Board Limitations x20 AP, x20 latearal 1x20   Gait Training focus on heel-toe pattern with boot x247ft    Other Standing Knee Exercises 3D hip excursions 1x10   Knee/Hip Exercises: Seated   Other Seated Knee/Hip Exercises Ankle DF/PF, inversion/eversion 1x15; ankle circles 1x10 L; ankle alphabet 1x2 L    Other Seated Knee/Hip Exercises Ankle DF/PF/inversion/eversion, knee flexion/extension with red TB 1x15   Knee/Hip Exercises: Supine   Straight Leg Raises Both;1 set;10 reps   Knee Extension Both;1 set;10 reps   Knee/Hip Exercises: Sidelying   Hip ABduction Both;1 set;10 reps   Knee/Hip Exercises: Prone   Hip Extension Both;1 set;10 reps                PT Education - 06/03/15 1728    Education provided Yes   Education Details reviewed initial eval and goals; advised patient that we will need MD clearance to weightbear without boot    Person(s) Educated Patient   Methods Explanation   Comprehension Verbalized understanding          PT Short Term Goals - 05/31/15 1653    PT SHORT TERM GOAL #1   Title Pt will be independent with HEP.  Time 2   Period Weeks   Status New   PT SHORT TERM GOAL #2   Title Improve ankle ROM by 5-10 degrees in all planes to improve gait mechanics.    Time 2   Period Weeks   Status New   PT SHORT TERM GOAL #3   Title Circumfrential measurement (figure 8) of L ankle will decrease by 2 cm to indicate decreased swelling in foot/ankle.   Time 2   Period Weeks   Status New   PT SHORT TERM GOAL #4   Title Pt will ambulate 450 feet during 3 minute walk test to demonstrate improved gait speed and functional mobility.   Time 2   Period Weeks   Status New   PT SHORT TERM GOAL #5   Title Improve ankle strength to 4-/5 to improve gait mechancis.    Time 2   Period Weeks   Status New           PT Long Term Goals - 05/31/15 1657    PT LONG TERM GOAL #1   Title Improve ankle strength to 4+/5  to improve gait mechanics.   Time 4   Period Weeks   Status New   PT LONG TERM GOAL #2   Title Pt will ascend/descend 12 stairs with reciprocal pattern and <2/10 pain.    Time 4   Period Weeks   Status New   PT LONG TERM GOAL #3   Title Pt will ambulate 600 feet without AD during 3 minute walk test to demonstrate improved gait speed and functional mobility.    Time 4   Period Weeks   Status New   PT LONG TERM GOAL #4   Title Improve ROM of L ankle to be equal to R ankle to improve gait mechanics and ankle mobility.    Time 4   Period Weeks   Status New   PT LONG TERM GOAL #5   Title Figure 8 measurement of L ankle to be less than or equal to 58 cm to demonstrate decreased swelling in foot/ankle.    Time 4   Period Weeks   Status New   Additional Long Term Goals   Additional Long Term Goals Yes   PT LONG TERM GOAL #6   Title Pt will complete TUG in 15 seconds without AD to demonstrate improved gait speed and decreased fall risk.                Plan - 06/03/15 1729    Clinical Impression Statement Introduced functional exercises and gait/stair training today with good tolerance by patient. Patient had some difficulty with stairs today but did improve with focus on step to pattern and correct sequenicng; also required some cues for improving gait mechanics today with improved ease of gait after training. No increased pain during  session, just fatigue.    Pt will benefit from skilled therapeutic intervention in order to improve on the following deficits Abnormal gait;Decreased activity tolerance;Decreased balance;Decreased endurance;Decreased range of motion;Decreased strength;Difficulty walking;Hypomobility   Rehab Potential Good   PT Frequency 2x / week   PT Duration 4 weeks   PT Treatment/Interventions ADLs/Self Care Home Management;Gait training;Stair training;Functional mobility training;Therapeutic activities;Therapeutic exercise;Balance training;Neuromuscular  re-education;Manual techniques;Passive range of motion   PT Next Visit Plan Continue with ankle mobility and strengthening, functional LE strength, gait and stair training    Consulted and Agree with Plan of Care Patient        Problem List Patient Active  Problem List   Diagnosis Date Noted  . Cyst of left breast 12/20/2013  . PALPITATIONS 04/15/2009  . CHEST PAIN-UNSPECIFIED 04/15/2009    Nedra Hai PT, DPT [id number]  [facility] [address] [place], [place], [address] Phone: [phone]   Fax:  [phone]

## 2015-06-05 ENCOUNTER — Ambulatory Visit (HOSPITAL_COMMUNITY): Payer: 59 | Admitting: Physical Therapy

## 2015-06-05 DIAGNOSIS — S82402D Unspecified fracture of shaft of left fibula, subsequent encounter for closed fracture with routine healing: Secondary | ICD-10-CM

## 2015-06-05 DIAGNOSIS — R29898 Other symptoms and signs involving the musculoskeletal system: Secondary | ICD-10-CM

## 2015-06-05 DIAGNOSIS — M25672 Stiffness of left ankle, not elsewhere classified: Secondary | ICD-10-CM

## 2015-06-05 DIAGNOSIS — M25472 Effusion, left ankle: Secondary | ICD-10-CM

## 2015-06-05 DIAGNOSIS — R262 Difficulty in walking, not elsewhere classified: Secondary | ICD-10-CM

## 2015-06-05 NOTE — Therapy (Signed)
[facility] [address] [place], [place], [address] Phone: [phone]   Fax:  [phone]  Physical Therapy Treatment  Patient Details  Name: [name] MRN: [id number] Date of Birth: 10-26-1955 Referring Provider:  Delton See, MD  Encounter Date: 06/05/2015      PT End of Session - 06/05/15 1611    Visit Number 3   Number of Visits 10   Date for PT Re-Evaluation 06/28/15   Authorization Type UHC   Authorization Time Period 05/31/15-07/26/15   PT Start Time 1523   PT Stop Time 1601   PT Time Calculation (min) 38 min   Activity Tolerance Patient tolerated treatment well   Behavior During Therapy [facility] for tasks assessed/performed      Past Medical History  Diagnosis Date  . Chest pain   . Palpitations   . Hypertension   . SVT (supraventricular tachycardia)     Past Surgical History  Procedure Laterality Date  . Tubal ligation      There were no vitals filed for this visit.  Visit Diagnosis:  Fracture of left fibula, closed, with routine healing, subsequent encounter  Difficulty walking  Stiffness of left ankle joint  Left ankle swelling  Weakness of left lower extremity      Subjective Assessment - 06/05/15 1522    Subjective Patient arrived still walking without assistive device, states that this is much easier for her after gait training last session    Currently in Pain? No/denies                         [facility] Adult PT Treatment/Exercise - 06/05/15 0001    Knee/Hip Exercises: Standing   Forward Lunges Both;1 set;10 reps   Forward Lunges Limitations 4 inch box, boot on    Side Lunges Both;1 set;10 reps   Side Lunges Limitations 4 inch box, boot on    Rocker Board Limitations x20 AP, x20 latearal 1x20 U UE    Gait Training focus on heel-toe pattern with boot x464ft    Other Standing Knee Exercises 3D hip excursions 1x15    Knee/Hip Exercises: Seated   Other Seated Knee/Hip  Exercises Ankle DF/PF, inversion/eversion 1x15; ankle circles 1x10 L; ankle alphabet 1x2 L; BAPS board in sitting  level 2 circles x10 each direction    Other Seated Knee/Hip Exercises Ankle DF/PF/inversion/eversion, knee flexion/extension with red TB 1x15   Sit to Sand --                PT Education - 06/05/15 1611    Education provided Yes   Education Details OKC vs CKC mobility of ankle; education regarding purpose of fibula    Person(s) Educated Patient   Methods Explanation   Comprehension Verbalized understanding          PT Short Term Goals - 05/31/15 1653    PT SHORT TERM GOAL #1   Title Pt will be independent with HEP.    Time 2   Period Weeks   Status New   PT SHORT TERM GOAL #2   Title Improve ankle ROM by 5-10 degrees in all planes to improve gait mechanics.    Time 2   Period Weeks   Status New   PT SHORT TERM GOAL #3   Title Circumfrential measurement (figure 8) of L ankle will decrease by 2 cm to indicate decreased swelling in foot/ankle.   Time 2   Period Weeks  Status New   PT SHORT TERM GOAL #4   Title Pt will ambulate 450 feet during 3 minute walk test to demonstrate improved gait speed and functional mobility.   Time 2   Period Weeks   Status New   PT SHORT TERM GOAL #5   Title Improve ankle strength to 4-/5 to improve gait mechancis.    Time 2   Period Weeks   Status New           PT Long Term Goals - 05/31/15 1657    PT LONG TERM GOAL #1   Title Improve ankle strength to 4+/5 to improve gait mechanics.   Time 4   Period Weeks   Status New   PT LONG TERM GOAL #2   Title Pt will ascend/descend 12 stairs with reciprocal pattern and <2/10 pain.    Time 4   Period Weeks   Status New   PT LONG TERM GOAL #3   Title Pt will ambulate 600 feet without AD during 3 minute walk test to demonstrate improved gait speed and functional mobility.    Time 4   Period Weeks   Status New   PT LONG TERM GOAL #4   Title Improve ROM of L ankle  to be equal to R ankle to improve gait mechanics and ankle mobility.    Time 4   Period Weeks   Status New   PT LONG TERM GOAL #5   Title Figure 8 measurement of L ankle to be less than or equal to 58 cm to demonstrate decreased swelling in foot/ankle.    Time 4   Period Weeks   Status New   Additional Long Term Goals   Additional Long Term Goals Yes   PT LONG TERM GOAL #6   Title Pt will complete TUG in 15 seconds without AD to demonstrate improved gait speed and decreased fall risk.                Plan - 06/05/15 1612    Clinical Impression Statement Continued functional mobilty and exercises for affected ankle, also introduced BAPS board in sitting today. Continued gait training in boot to promote efficient gait pattern and relieve excess stress on hip and back, focus on heel-toe pattern today. No increase in pain at end of session, patient does report significant muscle faitgue however.    Pt will benefit from skilled therapeutic intervention in order to improve on the following deficits Abnormal gait;Decreased activity tolerance;Decreased balance;Decreased endurance;Decreased range of motion;Decreased strength;Difficulty walking;Hypomobility   Rehab Potential Good   PT Frequency 2x / week   PT Duration 4 weeks   PT Treatment/Interventions ADLs/Self Care Home Management;Gait training;Stair training;Functional mobility training;Therapeutic activities;Therapeutic exercise;Balance training;Neuromuscular re-education;Manual techniques;Passive range of motion   PT Next Visit Plan Continue with ankle mobility and strengthening, functional LE strength, gait and stair training    Consulted and Agree with Plan of Care Patient        Problem List Patient Active Problem List   Diagnosis Date Noted  . Cyst of left breast 12/20/2013  . PALPITATIONS 04/15/2009  . CHEST PAIN-UNSPECIFIED 04/15/2009    Nedra Hai PT, DPT [phone]  [facility] [address] [place], [place], [address] Phone: [phone]   Fax:  [phone]

## 2015-06-11 ENCOUNTER — Telehealth (HOSPITAL_COMMUNITY): Payer: Self-pay | Admitting: Physical Therapy

## 2015-06-11 ENCOUNTER — Ambulatory Visit (HOSPITAL_COMMUNITY): Payer: 59 | Admitting: Physical Therapy

## 2015-06-11 NOTE — Telephone Encounter (Signed)
She is going out of town for work and can not come in today

## 2015-06-13 ENCOUNTER — Ambulatory Visit (HOSPITAL_COMMUNITY): Payer: 59

## 2015-06-13 DIAGNOSIS — M25472 Effusion, left ankle: Secondary | ICD-10-CM

## 2015-06-13 DIAGNOSIS — R262 Difficulty in walking, not elsewhere classified: Secondary | ICD-10-CM

## 2015-06-13 DIAGNOSIS — S82402D Unspecified fracture of shaft of left fibula, subsequent encounter for closed fracture with routine healing: Secondary | ICD-10-CM | POA: Diagnosis not present

## 2015-06-13 DIAGNOSIS — R29898 Other symptoms and signs involving the musculoskeletal system: Secondary | ICD-10-CM

## 2015-06-13 DIAGNOSIS — M25672 Stiffness of left ankle, not elsewhere classified: Secondary | ICD-10-CM

## 2015-06-13 NOTE — Therapy (Signed)
[facility] [address] [place], [place], [address] Phone: [phone]   Fax:  [phone]  Physical Therapy Treatment  Patient Details  Name: [name] MRN: [id number] Date of Birth: 1956-05-17 Referring Provider:  Kari Baars, MD  Encounter Date: 06/13/2015      PT End of Session - 06/13/15 1615    Visit Number 4   Number of Visits 10   Date for PT Re-Evaluation 06/28/15   Authorization Type UHC   Authorization Time Period 05/31/15-07/26/15   PT Start Time 1607   PT Stop Time 1645   PT Time Calculation (min) 38 min   Activity Tolerance Patient tolerated treatment well   Behavior During Therapy [facility] for tasks assessed/performed      Past Medical History  Diagnosis Date  . Chest pain   . Palpitations   . Hypertension   . SVT (supraventricular tachycardia)     Past Surgical History  Procedure Laterality Date  . Tubal ligation      There were no vitals filed for this visit.  Visit Diagnosis:  Fracture of left fibula, closed, with routine healing, subsequent encounter  Difficulty walking  Stiffness of left ankle joint  Left ankle swelling  Weakness of left lower extremity      Subjective Assessment - 06/13/15 1607    Subjective Pt stated she has been compliant with HEP, ambulates with no AD and reports no LOB episopdes.   Currently in Pain? No/denies             [facility] Adult PT Treatment/Exercise - 06/13/15 0001    Knee/Hip Exercises: Standing   Forward Lunges Both;1 set;10 reps   Forward Lunges Limitations 4 inch box, boot on    Side Lunges Both;1 set;10 reps   Side Lunges Limitations 4 inch box, boot on    Lateral Step Up Both;10 reps;Hand Hold: 2;Step Height: 4"   Lateral Step Up Limitations Boot on   Forward Step Up Both;10 reps;Hand Hold: 1;Step Height: 6"   Forward Step Up Limitations Boot on   Rocker Board 2 minutes   Rocker Board Limitations A/P and R.L   Gait Training focus on  heel-toe pattern with boot x267ft   Other Standing Knee Exercises 3D hip excursions 1x15    Knee/Hip Exercises: Seated   Other Seated Knee/Hip Exercises BAPS L3 10x            PT Short Term Goals - 05/31/15 1653    PT SHORT TERM GOAL #1   Title Pt will be independent with HEP.    Time 2   Period Weeks   Status New   PT SHORT TERM GOAL #2   Title Improve ankle ROM by 5-10 degrees in all planes to improve gait mechanics.    Time 2   Period Weeks   Status New   PT SHORT TERM GOAL #3   Title Circumfrential measurement (figure 8) of L ankle will decrease by 2 cm to indicate decreased swelling in foot/ankle.   Time 2   Period Weeks   Status New   PT SHORT TERM GOAL #4   Title Pt will ambulate 450 feet during 3 minute walk test to demonstrate improved gait speed and functional mobility.   Time 2   Period Weeks   Status New   PT SHORT TERM GOAL #5   Title Improve ankle strength to 4-/5 to improve gait mechancis.    Time 2   Period Weeks   Status  New           PT Long Term Goals - 05/31/15 1657    PT LONG TERM GOAL #1   Title Improve ankle strength to 4+/5 to improve gait mechanics.   Time 4   Period Weeks   Status New   PT LONG TERM GOAL #2   Title Pt will ascend/descend 12 stairs with reciprocal pattern and <2/10 pain.    Time 4   Period Weeks   Status New   PT LONG TERM GOAL #3   Title Pt will ambulate 600 feet without AD during 3 minute walk test to demonstrate improved gait speed and functional mobility.    Time 4   Period Weeks   Status New   PT LONG TERM GOAL #4   Title Improve ROM of L ankle to be equal to R ankle to improve gait mechanics and ankle mobility.    Time 4   Period Weeks   Status New   PT LONG TERM GOAL #5   Title Figure 8 measurement of L ankle to be less than or equal to 58 cm to demonstrate decreased swelling in foot/ankle.    Time 4   Period Weeks   Status New   Additional Long Term Goals   Additional Long Term Goals Yes   PT LONG  TERM GOAL #6   Title Pt will complete TUG in 15 seconds without AD to demonstrate improved gait speed and decreased fall risk.                Plan - 06/13/15 1644    Clinical Impression Statement Session focus on functional strengthening for LE.  Pt able to verbalize and demonstrate appropriate gait mechanics for heel to toe just minor cueing for stance phase while in boot.  Began stair training for functional strengtheing with min cueing for form and control.  Improved ankle mobility noted with abilty to increase BAPS board to L3 with min cueing  to reduce compensation with knee.  No reports of pain through session.   PT Next Visit Plan Continue with ankle mobility and strengthening, functional LE strength, gait and stair training         Problem List Patient Active Problem List   Diagnosis Date Noted  . Cyst of left breast 12/20/2013  . PALPITATIONS 04/15/2009  . CHEST PAIN-UNSPECIFIED 04/15/2009   Becky Sax, LPTA; CBIS [phone]  Juel Burrow 06/13/2015, 7:07 PM  [facility] [address] [place], [place], [address] Phone: [phone]   Fax:  [phone]

## 2015-06-18 ENCOUNTER — Ambulatory Visit (HOSPITAL_COMMUNITY): Payer: 59 | Admitting: Physical Therapy

## 2015-06-18 DIAGNOSIS — S82402D Unspecified fracture of shaft of left fibula, subsequent encounter for closed fracture with routine healing: Secondary | ICD-10-CM | POA: Diagnosis not present

## 2015-06-18 DIAGNOSIS — M25472 Effusion, left ankle: Secondary | ICD-10-CM

## 2015-06-18 DIAGNOSIS — M25672 Stiffness of left ankle, not elsewhere classified: Secondary | ICD-10-CM

## 2015-06-18 DIAGNOSIS — R29898 Other symptoms and signs involving the musculoskeletal system: Secondary | ICD-10-CM

## 2015-06-18 DIAGNOSIS — R262 Difficulty in walking, not elsewhere classified: Secondary | ICD-10-CM

## 2015-06-18 NOTE — Therapy (Signed)
[facility] [address] [place], [place], [address] Phone: [phone]   Fax:  [phone]  Physical Therapy Treatment  Patient Details  Name: [name] MRN: [id number] Date of Birth: 11/16/1955 Referring Provider:  Delton See, MD  Encounter Date: 06/18/2015      PT End of Session - 06/18/15 1628    Visit Number 5   Number of Visits 10   Date for PT Re-Evaluation 06/28/15   Authorization Type UHC   Authorization Time Period 05/31/15-07/26/15   PT Start Time 1608   PT Stop Time 1650   PT Time Calculation (min) 42 min   Activity Tolerance Patient tolerated treatment well   Behavior During Therapy [facility] for tasks assessed/performed      Past Medical History  Diagnosis Date  . Chest pain   . Palpitations   . Hypertension   . SVT (supraventricular tachycardia)     Past Surgical History  Procedure Laterality Date  . Tubal ligation      There were no vitals filed for this visit.  Visit Diagnosis:  Fracture of left fibula, closed, with routine healing, subsequent encounter  Difficulty walking  Stiffness of left ankle joint  Left ankle swelling  Weakness of left lower extremity          [facility] PT Assessment - 06/18/15 0001    Assessment   Medical Diagnosis L fibula fracture   Next MD Visit 06/28/15   Restrictions   Weight Bearing Restrictions Yes   LLE Weight Bearing Weight bearing as tolerated   Other Position/Activity Restrictions WBAT in boot                     [facility] Adult PT Treatment/Exercise - 06/18/15 1615    Knee/Hip Exercises: Standing   Forward Lunges Both;1 set;15 reps   Forward Lunges Limitations 4 inch box, boot on    Side Lunges Both;1 set;15 reps   Side Lunges Limitations 4 inch box, boot on    Lateral Step Up Both;10 reps;Hand Hold: 2;Step Height: 4"   Lateral Step Up Limitations Boot on   Forward Step Up Both;10 reps;Hand Hold: 1;Step Height: 6"   Forward Step Up  Limitations Boot on   Rocker Board 2 minutes   Rocker Board Limitations A/P and R.L   Other Standing Knee Exercises hamstring stretch 3X30" onto 12" step   Knee/Hip Exercises: Seated   Other Seated Knee/Hip Exercises BAPS L3 10x   Other Seated Knee/Hip Exercises Ankle DF/PF/inversion/eversion, with green TB 1x15                  PT Short Term Goals - 05/31/15 1653    PT SHORT TERM GOAL #1   Title Pt will be independent with HEP.    Time 2   Period Weeks   Status New   PT SHORT TERM GOAL #2   Title Improve ankle ROM by 5-10 degrees in all planes to improve gait mechanics.    Time 2   Period Weeks   Status New   PT SHORT TERM GOAL #3   Title Circumfrential measurement (figure 8) of L ankle will decrease by 2 cm to indicate decreased swelling in foot/ankle.   Time 2   Period Weeks   Status New   PT SHORT TERM GOAL #4   Title Pt will ambulate 450 feet during 3 minute walk test to demonstrate improved gait speed and functional mobility.   Time 2  Period Weeks   Status New   PT SHORT TERM GOAL #5   Title Improve ankle strength to 4-/5 to improve gait mechancis.    Time 2   Period Weeks   Status New           PT Long Term Goals - 05/31/15 1657    PT LONG TERM GOAL #1   Title Improve ankle strength to 4+/5 to improve gait mechanics.   Time 4   Period Weeks   Status New   PT LONG TERM GOAL #2   Title Pt will ascend/descend 12 stairs with reciprocal pattern and <2/10 pain.    Time 4   Period Weeks   Status New   PT LONG TERM GOAL #3   Title Pt will ambulate 600 feet without AD during 3 minute walk test to demonstrate improved gait speed and functional mobility.    Time 4   Period Weeks   Status New   PT LONG TERM GOAL #4   Title Improve ROM of L ankle to be equal to R ankle to improve gait mechanics and ankle mobility.    Time 4   Period Weeks   Status New   PT LONG TERM GOAL #5   Title Figure 8 measurement of L ankle to be less than or equal to 58 cm  to demonstrate decreased swelling in foot/ankle.    Time 4   Period Weeks   Status New   Additional Long Term Goals   Additional Long Term Goals Yes   PT LONG TERM GOAL #6   Title Pt will complete TUG in 15 seconds without AD to demonstrate improved gait speed and decreased fall risk.                Plan - 06/18/15 1629    Clinical Impression Statement Continued focus on improving functional strength of Lt LE.  Pt returns to MD next week with hopes of discharging boot to return to normal ambulation.  Limited as to adding additonal exercises at this point due to need to wear boot with weight bearing.    PT Next Visit Plan Continue with ankle mobility and strengthening, functional LE strength, gait and stair training.  Complete progress note if needed for return to MD next session.  Progress single leg stability when discharged from boot.          Problem List Patient Active Problem List   Diagnosis Date Noted  . Cyst of left breast 12/20/2013  . PALPITATIONS 04/15/2009  . CHEST PAIN-UNSPECIFIED 04/15/2009    Lurena Nida, PTA/CLT [phone] 06/18/2015, 5:22 PM  [facility] [address] [place], [place], [address] Phone: [phone]   Fax:  [phone]

## 2015-06-20 ENCOUNTER — Ambulatory Visit (HOSPITAL_COMMUNITY): Payer: 59 | Attending: Sports Medicine | Admitting: Physical Therapy

## 2015-06-20 ENCOUNTER — Telehealth (HOSPITAL_COMMUNITY): Payer: Self-pay | Admitting: Physical Therapy

## 2015-06-20 DIAGNOSIS — X58XXXD Exposure to other specified factors, subsequent encounter: Secondary | ICD-10-CM | POA: Insufficient documentation

## 2015-06-20 DIAGNOSIS — R29898 Other symptoms and signs involving the musculoskeletal system: Secondary | ICD-10-CM | POA: Insufficient documentation

## 2015-06-20 DIAGNOSIS — R262 Difficulty in walking, not elsewhere classified: Secondary | ICD-10-CM | POA: Insufficient documentation

## 2015-06-20 DIAGNOSIS — M25472 Effusion, left ankle: Secondary | ICD-10-CM | POA: Insufficient documentation

## 2015-06-20 DIAGNOSIS — S82402D Unspecified fracture of shaft of left fibula, subsequent encounter for closed fracture with routine healing: Secondary | ICD-10-CM | POA: Insufficient documentation

## 2015-06-20 DIAGNOSIS — M25672 Stiffness of left ankle, not elsewhere classified: Secondary | ICD-10-CM | POA: Insufficient documentation

## 2015-06-20 NOTE — Telephone Encounter (Signed)
Patient a no-show for today's appointment. Called patient's mobile and she was very apologetic, reporting that she had gotten tied up at work and had forgotten to call to cancel appointment. Requested that she be placed on waiting list for tomorrow. Reminded patient of time/date of next appointment however patient reported that she may not be able to make this and is going to call clinic back to discuss possibly changing appointment times.  Nedra Hai PT, DPT [phone]

## 2015-06-25 ENCOUNTER — Ambulatory Visit (HOSPITAL_COMMUNITY): Payer: 59 | Admitting: Physical Therapy

## 2015-06-25 ENCOUNTER — Encounter (HOSPITAL_COMMUNITY): Payer: 59 | Admitting: Physical Therapy

## 2015-06-25 DIAGNOSIS — X58XXXD Exposure to other specified factors, subsequent encounter: Secondary | ICD-10-CM | POA: Diagnosis not present

## 2015-06-25 DIAGNOSIS — M25472 Effusion, left ankle: Secondary | ICD-10-CM

## 2015-06-25 DIAGNOSIS — S82402D Unspecified fracture of shaft of left fibula, subsequent encounter for closed fracture with routine healing: Secondary | ICD-10-CM

## 2015-06-25 DIAGNOSIS — M25672 Stiffness of left ankle, not elsewhere classified: Secondary | ICD-10-CM

## 2015-06-25 DIAGNOSIS — R29898 Other symptoms and signs involving the musculoskeletal system: Secondary | ICD-10-CM

## 2015-06-25 DIAGNOSIS — R262 Difficulty in walking, not elsewhere classified: Secondary | ICD-10-CM | POA: Diagnosis present

## 2015-06-25 NOTE — Therapy (Signed)
[facility] [address] [place], [place], [address] Phone: [phone]   Fax:  [phone]  Physical Therapy Treatment  Patient Details  Name: RAMI WADDLE MRN: [id number] Date of Birth: 06-25-1956 Referring Provider:  Kari Baars, MD  Encounter Date: 06/25/2015      PT End of Session - 06/25/15 1731    Visit Number 6   Number of Visits 10   Date for PT Re-Evaluation 06/28/15   Authorization Type UHC   Authorization Time Period 05/31/15-07/26/15   PT Start Time 1653   PT Stop Time 1727   PT Time Calculation (min) 34 min   Activity Tolerance Patient tolerated treatment well   Behavior During Therapy [facility] for tasks assessed/performed      Past Medical History  Diagnosis Date  . Chest pain   . Palpitations   . Hypertension   . SVT (supraventricular tachycardia)     Past Surgical History  Procedure Laterality Date  . Tubal ligation      There were no vitals filed for this visit.  Visit Diagnosis:  Fracture of left fibula, closed, with routine healing, subsequent encounter  Difficulty walking  Stiffness of left ankle joint  Left ankle swelling  Weakness of left lower extremity      Subjective Assessment - 06/25/15 1656    Subjective Patient arrived today without boot on, reports that MD took her out of the boot but does want her to wear a brace, however she did not put on her brace today as she was in a rush to get to work. States that her ankle is feeling tired and that she notices it is weak for sure.    Currently in Pain? No/denies  occasional soreness                          [facility] Adult PT Treatment/Exercise - 06/25/15 0001    Ambulation/Gait   Gait Comments Gait and stair training without boot; focus on heel-toe gait and step over step pattern today    Knee/Hip Exercises: Standing   Forward Lunges --   Forward Lunges Limitations --   Side Lunges --   Side Lunges Limitations --    Rocker Board Limitations x20 AP, x20 lateral B HHA    Other Standing Knee Exercises Weight shifts on foam pad 3x30 seconds with B HHA    Other Standing Knee Exercises BaPS level 1 with B HHA x5 each direction    Knee/Hip Exercises: Seated   Other Seated Knee/Hip Exercises Ankle ROM in PF/DF/eersion/inversion 1x15 with and without green TB 1x15; ankle alphabets 1x2 each side                 PT Education - 06/25/15 1731    Education provided Yes   Education Details educated to ice after PT and wear brace at home tonight; educated on importance of ankle strength and mobility for balance    Person(s) Educated Patient   Methods Explanation   Comprehension Verbalized understanding          PT Short Term Goals - 05/31/15 1653    PT SHORT TERM GOAL #1   Title Pt will be independent with HEP.    Time 2   Period Weeks   Status New   PT SHORT TERM GOAL #2   Title Improve ankle ROM by 5-10 degrees in all planes to improve gait mechanics.    Time 2  Period Weeks   Status New   PT SHORT TERM GOAL #3   Title Circumfrential measurement (figure 8) of L ankle will decrease by 2 cm to indicate decreased swelling in foot/ankle.   Time 2   Period Weeks   Status New   PT SHORT TERM GOAL #4   Title Pt will ambulate 450 feet during 3 minute walk test to demonstrate improved gait speed and functional mobility.   Time 2   Period Weeks   Status New   PT SHORT TERM GOAL #5   Title Improve ankle strength to 4-/5 to improve gait mechancis.    Time 2   Period Weeks   Status New           PT Long Term Goals - 05/31/15 1657    PT LONG TERM GOAL #1   Title Improve ankle strength to 4+/5 to improve gait mechanics.   Time 4   Period Weeks   Status New   PT LONG TERM GOAL #2   Title Pt will ascend/descend 12 stairs with reciprocal pattern and <2/10 pain.    Time 4   Period Weeks   Status New   PT LONG TERM GOAL #3   Title Pt will ambulate 600 feet without AD during 3 minute walk  test to demonstrate improved gait speed and functional mobility.    Time 4   Period Weeks   Status New   PT LONG TERM GOAL #4   Title Improve ROM of L ankle to be equal to R ankle to improve gait mechanics and ankle mobility.    Time 4   Period Weeks   Status New   PT LONG TERM GOAL #5   Title Figure 8 measurement of L ankle to be less than or equal to 58 cm to demonstrate decreased swelling in foot/ankle.    Time 4   Period Weeks   Status New   Additional Long Term Goals   Additional Long Term Goals Yes   PT LONG TERM GOAL #6   Title Pt will complete TUG in 15 seconds without AD to demonstrate improved gait speed and decreased fall risk.                Plan - 06/25/15 1731    Clinical Impression Statement Patient arrived with boot off per MD, who would like her to wear ankle brace now; however patient did not have ankle brace today and as such treatment was adjusted since ankle was already fatigued. Introduced cautious standing exercises on foam, rockerboard, and [employer]. Also reviewed gait and stair training today wtihout boot. Patient appeared to tolerate sesion well however reported that her ankle was fairly  fatigued at end of session.  No increase in pain today.    Pt will benefit from skilled therapeutic intervention in order to improve on the following deficits Abnormal gait;Decreased activity tolerance;Decreased balance;Decreased endurance;Decreased range of motion;Decreased strength;Difficulty walking;Hypomobility   Rehab Potential Good   PT Frequency 2x / week   PT Duration 4 weeks   PT Treatment/Interventions ADLs/Self Care Home Management;Gait training;Stair training;Functional mobility training;Therapeutic activities;Therapeutic exercise;Balance training;Neuromuscular re-education;Manual techniques;Passive range of motion   PT Next Visit Plan Continue ankle strength and stability, introduce balance work, ankle mobility. Patient needs ankle brace for PT.          Problem List Patient Active Problem List   Diagnosis Date Noted  . Cyst of left breast 12/20/2013  . PALPITATIONS 04/15/2009  . CHEST PAIN-UNSPECIFIED 04/15/2009  Deniece Ree PT, DPT [phone]  [place] [address] [place], [place], [address] Phone: [phone]   Fax:  [phone]

## 2015-06-27 ENCOUNTER — Encounter (HOSPITAL_COMMUNITY): Payer: 59 | Admitting: Physical Therapy

## 2015-07-01 ENCOUNTER — Ambulatory Visit (HOSPITAL_COMMUNITY): Payer: 59 | Admitting: Physical Therapy

## 2015-07-01 DIAGNOSIS — M25472 Effusion, left ankle: Secondary | ICD-10-CM

## 2015-07-01 DIAGNOSIS — S82402D Unspecified fracture of shaft of left fibula, subsequent encounter for closed fracture with routine healing: Secondary | ICD-10-CM | POA: Diagnosis not present

## 2015-07-01 DIAGNOSIS — M25672 Stiffness of left ankle, not elsewhere classified: Secondary | ICD-10-CM

## 2015-07-01 DIAGNOSIS — R262 Difficulty in walking, not elsewhere classified: Secondary | ICD-10-CM

## 2015-07-01 DIAGNOSIS — R29898 Other symptoms and signs involving the musculoskeletal system: Secondary | ICD-10-CM

## 2015-07-01 NOTE — Therapy (Signed)
[facility] [facility] Phone: [phone]   Fax:  [phone]  Physical Therapy Treatment (Re-Assessment)  Patient Details  Name: [name] MRN: [id number] Date of Birth: 05/14/1956 Referring Provider:  Kari Baars, MD  Encounter Date: 07/01/2015      PT End of Session - 07/01/15 1607    Visit Number 7   Number of Visits 15   Date for PT Re-Evaluation 07/29/15   Authorization Type UHC   Authorization Time Period 05/31/15-07/26/15   PT Start Time 1521   PT Stop Time 1602   PT Time Calculation (min) 41 min   Activity Tolerance Patient tolerated treatment well   Behavior During Therapy [facility] for tasks assessed/performed      Past Medical History  Diagnosis Date  . Chest pain   . Palpitations   . Hypertension   . SVT (supraventricular tachycardia)     Past Surgical History  Procedure Laterality Date  . Tubal ligation      There were no vitals filed for this visit.  Visit Diagnosis:  Fracture of left fibula, closed, with routine healing, subsequent encounter  Difficulty walking  Stiffness of left ankle joint  Left ankle swelling  Weakness of left lower extremity      Subjective Assessment - 07/01/15 1523    Subjective Patient arrived today in ankle brace, some tenderness in the general area of L achilles. Otherwise doing well.    How long can you sit comfortably? 9/12- no limits    How long can you stand comfortably? 9/12- 20 minutes    How long can you walk comfortably? 9/12- 25-30 to minutes if walking slowly    Patient Stated Goals Be able to walk normally, increase WB on foot, return to PLOF   Currently in Pain? Yes   Pain Score 3    Pain Location Ankle   Pain Orientation Left            [facility] PT Assessment - 07/01/15 0001    Observation/Other Assessments   Focus on Therapeutic Outcomes (FOTO)  41% limited    AROM   Right Ankle Dorsiflexion 10   Right Ankle  Plantar Flexion 50   Right Ankle Inversion 45   Right Ankle Eversion 15  attempted to compensate with hip for increased ROM    Left Ankle Dorsiflexion -5   Left Ankle Plantar Flexion 38   Left Ankle Inversion 26   Left Ankle Eversion 12  attempted to compensate with hip for increased ROM    Strength   Right Ankle Dorsiflexion 5/5   Right Ankle Plantar Flexion 3+/5   Right Ankle Inversion 5/5   Right Ankle Eversion 5/5   Left Ankle Dorsiflexion 4/5   Left Ankle Plantar Flexion 2/5   Left Ankle Inversion 4/5   Left Ankle Eversion 4/5   other   Comments Figure 8 L 57cm today; howver patient did have compression wraps on pre-PT    Transfers   Five time sit to stand comments  13.5   Ambulation/Gait   Gait velocity TUG 11.1, 9.7, 9.1   6 minute walk test results    Aerobic Endurance Distance Walked 619   Endurance additional comments 3 minute walk                      [facility] Adult PT Treatment/Exercise - 07/01/15 0001    Knee/Hip Exercises: Stretches   Active Hamstring Stretch Both;2 reps;30 seconds  Active Hamstring Stretch Limitations on stairs    Piriformis Stretch Both;2 reps;30 seconds   Piriformis Stretch Limitations seated    Gastroc Stretch Both;3 reps;30 seconds   Gastroc Stretch Limitations slantboard                 PT Education - 07/01/15 1607    Education provided Yes   Education Details progress with skilled PT services, plan of care moving forward    Person(s) Educated Patient   Methods Explanation   Comprehension Verbalized understanding          PT Short Term Goals - 07/01/15 1559    PT SHORT TERM GOAL #1   Title Pt will be independent with HEP.    Time 2   Period Weeks   Status Achieved   PT SHORT TERM GOAL #2   Title Improve ankle ROM by 5-10 degrees in all planes to improve gait mechanics.    Time 2   Period Weeks   Status On-going   PT SHORT TERM GOAL #3   Title Circumfrential measurement (figure 8) of L ankle will  decrease by 2 cm to indicate decreased swelling in foot/ankle.   Time 2   Period Weeks   Status Achieved   PT SHORT TERM GOAL #4   Title Pt will ambulate 450 feet during 3 minute walk test to demonstrate improved gait speed and functional mobility.   Time 2   Period Weeks   Status Achieved   PT SHORT TERM GOAL #5   Title Improve ankle strength to 4-/5 to improve gait mechancis.    Time 2   Period Weeks   Status New           PT Long Term Goals - 07/01/15 1600    PT LONG TERM GOAL #1   Title Improve ankle strength to 4+/5 to improve gait mechanics.   Time 4   Period Weeks   Status On-going   PT LONG TERM GOAL #2   Title Pt will ascend/descend 12 stairs with reciprocal pattern and <2/10 pain.    Time 4   Period Weeks   Status On-going   PT LONG TERM GOAL #3   Title Pt will ambulate 600 feet without AD during 3 minute walk test to demonstrate improved gait speed and functional mobility.    Time 4   Period Weeks   Status Achieved   PT LONG TERM GOAL #4   Title Improve ROM of L ankle to be equal to R ankle to improve gait mechanics and ankle mobility.    Time 4   Period Weeks   Status On-going   PT LONG TERM GOAL #5   Title Figure 8 measurement of L ankle to be less than or equal to 58 cm to demonstrate decreased swelling in foot/ankle.    Time 4   Period Weeks   Status Achieved   PT LONG TERM GOAL #6   Title Pt will complete TUG in 15 seconds without AD to demonstrate improved gait speed and decreased fall risk.    Status Achieved               Plan - 07/01/15 1613    Clinical Impression Statement Re-assessment performed today. Patient shows increase in balance and overall gait mechanics as well as posture, however patint does continue to demonstrate ankle weakness (especially in PF groups) as wella s reduced ROM and poor muscle endurance around ankle, all of which contribute to her possibiliy of  re-injury. At this time patient will benefit from a ctonintuation  of skilled PT services in order to address functional deficits and assist her in reaching an optimal level of function with reduced risk of re-injury or tissue aggravation.    Pt will benefit from skilled therapeutic intervention in order to improve on the following deficits Abnormal gait;Decreased activity tolerance;Decreased balance;Decreased endurance;Decreased range of motion;Decreased strength;Difficulty walking;Hypomobility   Rehab Potential Good   PT Frequency 2x / week   PT Duration 4 weeks   PT Treatment/Interventions ADLs/Self Care Home Management;Gait training;Stair training;Functional mobility training;Therapeutic activities;Therapeutic exercise;Balance training;Neuromuscular re-education;Manual techniques;Passive range of motion   PT Next Visit Plan Continue ankle strength and stability, introduce balance work, ankle mobility. Patient needs ankle brace for PT.    Consulted and Agree with Plan of Care Patient        Problem List Patient Active Problem List   Diagnosis Date Noted  . Cyst of left breast 12/20/2013  . PALPITATIONS 04/15/2009  . CHEST PAIN-UNSPECIFIED 04/15/2009    Physical Therapy Progress Note  Dates of Reporting Period: 05/31/15  to 07/01/15  Objective Reports of Subjective Statement: see above   Objective Measurements: see above   Goal Update: see above   Plan: see above   Reason Skilled Services are Required:  Reduced ankle ROM, reduced ankle strength, impaired gait, reduced functional activity tolerance, impaired balance    Nedra Hai PT, DPT [phone]  [facility] [address] [place], [place], [address] Phone: [phone]   Fax:  [phone]

## 2015-07-02 ENCOUNTER — Encounter (HOSPITAL_COMMUNITY): Payer: 59 | Admitting: Physical Therapy

## 2015-07-04 ENCOUNTER — Encounter (HOSPITAL_COMMUNITY): Payer: 59 | Admitting: Physical Therapy

## 2015-07-04 ENCOUNTER — Ambulatory Visit (HOSPITAL_COMMUNITY): Payer: 59 | Admitting: Physical Therapy

## 2015-07-04 DIAGNOSIS — R29898 Other symptoms and signs involving the musculoskeletal system: Secondary | ICD-10-CM

## 2015-07-04 DIAGNOSIS — S82402D Unspecified fracture of shaft of left fibula, subsequent encounter for closed fracture with routine healing: Secondary | ICD-10-CM | POA: Diagnosis not present

## 2015-07-04 DIAGNOSIS — M25472 Effusion, left ankle: Secondary | ICD-10-CM

## 2015-07-04 DIAGNOSIS — R262 Difficulty in walking, not elsewhere classified: Secondary | ICD-10-CM

## 2015-07-04 DIAGNOSIS — M25672 Stiffness of left ankle, not elsewhere classified: Secondary | ICD-10-CM

## 2015-07-04 NOTE — Therapy (Signed)
[place] [facility] [address] [place], [place], [address] Phone: [phone]   Fax:  [phone]  Physical Therapy Treatment  Patient Details  Name: [name] MRN: [id number] Date of Birth: Mar 11, 1956 Referring Provider:  Kari Baars, MD  Encounter Date: 07/04/2015      PT End of Session - 07/04/15 1729    Visit Number 8   Number of Visits 15   Date for PT Re-Evaluation 07/29/15   Authorization Type UHC   Authorization Time Period 05/31/15-07/26/15   PT Start Time 1648   PT Stop Time 1726   PT Time Calculation (min) 38 min   Activity Tolerance Patient tolerated treatment well   Behavior During Therapy [facility] for tasks assessed/performed      Past Medical History  Diagnosis Date  . Chest pain   . Palpitations   . Hypertension   . SVT (supraventricular tachycardia)     Past Surgical History  Procedure Laterality Date  . Tubal ligation      There were no vitals filed for this visit.  Visit Diagnosis:  Fracture of left fibula, closed, with routine healing, subsequent encounter  Difficulty walking  Stiffness of left ankle joint  Left ankle swelling  Weakness of left lower extremity      Subjective Assessment - 07/04/15 1650    Subjective Patient arrived today wearing brace on ankle, no pain today.    Currently in Pain? No/denies                         [facility] Adult PT Treatment/Exercise - 07/04/15 0001    Knee/Hip Exercises: Stretches   Active Hamstring Stretch Both;2 reps;30 seconds   Active Hamstring Stretch Limitations on stairs    Piriformis Stretch Both;2 reps;30 seconds   Piriformis Stretch Limitations seated    Gastroc Stretch Both;3 reps;30 seconds   Gastroc Stretch Limitations slantboard    Knee/Hip Exercises: Standing   Heel Raises Both;1 set;10 reps   Heel Raises Limitations heel raises and toe raises    Forward Lunges Both;1 set;10 reps   Forward Lunges Limitations 4 inch box     Side Lunges Both;1 set;10 reps   Side Lunges Limitations 4 inch box    Rocker Board Limitations x20 AP, x20 lateral U HHA    SLS focus on L LE and B HHA    patient did feel some stress on ankle even with B HHA    Gait Training 275ftx2 with cues for heel-toe gait and UE swing    Other Standing Knee Exercises Weight shifts on foam pad 3x30 seconds with B HHA ; heel walking 67ftx3; hip ABD walks 2x60ft    Other Standing Knee Exercises Body blade on solid surface, B feet on ground, as tolerated x30   Knee/Hip Exercises: Seated   Other Seated Knee/Hip Exercises ankle circles 1x15 each way; ankle alphabet 1x3                 PT Education - 07/04/15 1729    Education provided No          PT Short Term Goals - 07/01/15 1559    PT SHORT TERM GOAL #1   Title Pt will be independent with HEP.    Time 2   Period Weeks   Status Achieved   PT SHORT TERM GOAL #2   Title Improve ankle ROM by 5-10 degrees in all planes to improve gait mechanics.    Time  2   Period Weeks   Status On-going   PT SHORT TERM GOAL #3   Title Circumfrential measurement (figure 8) of L ankle will decrease by 2 cm to indicate decreased swelling in foot/ankle.   Time 2   Period Weeks   Status Achieved   PT SHORT TERM GOAL #4   Title Pt will ambulate 450 feet during 3 minute walk test to demonstrate improved gait speed and functional mobility.   Time 2   Period Weeks   Status Achieved   PT SHORT TERM GOAL #5   Title Improve ankle strength to 4-/5 to improve gait mechancis.    Time 2   Period Weeks   Status New           PT Long Term Goals - 07/01/15 1600    PT LONG TERM GOAL #1   Title Improve ankle strength to 4+/5 to improve gait mechanics.   Time 4   Period Weeks   Status On-going   PT LONG TERM GOAL #2   Title Pt will ascend/descend 12 stairs with reciprocal pattern and <2/10 pain.    Time 4   Period Weeks   Status On-going   PT LONG TERM GOAL #3   Title Pt will ambulate 600 feet  without AD during 3 minute walk test to demonstrate improved gait speed and functional mobility.    Time 4   Period Weeks   Status Achieved   PT LONG TERM GOAL #4   Title Improve ROM of L ankle to be equal to R ankle to improve gait mechanics and ankle mobility.    Time 4   Period Weeks   Status On-going   PT LONG TERM GOAL #5   Title Figure 8 measurement of L ankle to be less than or equal to 58 cm to demonstrate decreased swelling in foot/ankle.    Time 4   Period Weeks   Status Achieved   PT LONG TERM GOAL #6   Title Pt will complete TUG in 15 seconds without AD to demonstrate improved gait speed and decreased fall risk.    Status Achieved               Plan - 07/04/15 1730    Clinical Impression Statement Continued functional exercises and stretches today with introduction of more mobility and stability exercises for ankle, good tolerance by patient. However she did feel some stress and fatigue in L ankle in exercises even with B HHA, indicating ongoing instability and muscle weakness present. Patient had slight increase in pain, to 1/10, at end of session, and was advised to ice and elevate when she arrives home.    Pt will benefit from skilled therapeutic intervention in order to improve on the following deficits Abnormal gait;Decreased activity tolerance;Decreased balance;Decreased endurance;Decreased range of motion;Decreased strength;Difficulty walking;Hypomobility   Rehab Potential Good   PT Frequency 2x / week   PT Duration 4 weeks   PT Treatment/Interventions ADLs/Self Care Home Management;Gait training;Stair training;Functional mobility training;Therapeutic activities;Therapeutic exercise;Balance training;Neuromuscular re-education;Manual techniques;Passive range of motion   PT Next Visit Plan Continue ankle strength and stability, introduce balance work, ankle mobility. Patient needs ankle brace for PT.    Consulted and Agree with Plan of Care Patient         Problem List Patient Active Problem List   Diagnosis Date Noted  . Cyst of left breast 12/20/2013  . PALPITATIONS 04/15/2009  . CHEST PAIN-UNSPECIFIED 04/15/2009    Nedra Hai PT, DPT [phone]  [place], [place], [address] Phone: [phone]   Fax:  [phone]

## 2015-07-11 ENCOUNTER — Ambulatory Visit (HOSPITAL_COMMUNITY): Payer: 59 | Admitting: Physical Therapy

## 2015-07-16 ENCOUNTER — Encounter (HOSPITAL_COMMUNITY): Payer: 59 | Admitting: Physical Therapy

## 2015-07-18 ENCOUNTER — Encounter (HOSPITAL_COMMUNITY): Payer: 59 | Admitting: Physical Therapy

## 2015-07-23 ENCOUNTER — Encounter (HOSPITAL_COMMUNITY): Payer: 59 | Admitting: Physical Therapy

## 2015-07-25 ENCOUNTER — Ambulatory Visit (HOSPITAL_COMMUNITY): Payer: 59 | Admitting: Physical Therapy

## 2015-08-14 ENCOUNTER — Encounter (HOSPITAL_COMMUNITY): Payer: Self-pay | Admitting: Physical Therapy

## 2015-08-14 NOTE — Therapy (Signed)
[place], [place], [address] Phone: [phone]   Fax:  [phone]  Patient Details  Name: [name] MRN: [id number] Date of Birth: 23-Mar-1956 Referring Provider:  No ref. provider found  Encounter Date: 08/14/2015   PHYSICAL THERAPY DISCHARGE SUMMARY  Visits from Start of Care: 8   Current functional level related to goals / functional outcomes:  PT LONG TERM GOAL #1   Title Improve ankle strength to 4+/5 to improve gait mechanics.   Time 4   Period Weeks   Status On-going   PT LONG TERM GOAL #2   Title Pt will ascend/descend 12 stairs with reciprocal pattern and <2/10 pain.    Time 4   Period Weeks   Status On-going   PT LONG TERM GOAL #3   Title Pt will ambulate 600 feet without AD during 3 minute walk test to demonstrate improved gait speed and functional mobility.    Time 4   Period Weeks   Status Achieved   PT LONG TERM GOAL #4   Title Improve ROM of L ankle to be equal to R ankle to improve gait mechanics and ankle mobility.    Time 4   Period Weeks   Status On-going   PT LONG TERM GOAL #5   Title Figure 8 measurement of L ankle to be less than or equal to 58 cm to demonstrate decreased swelling in foot/ankle.    Time 4   Period Weeks   Status Achieved   PT LONG TERM GOAL #6   Title Pt will complete TUG in 15 seconds without AD to demonstrate improved gait speed and decreased fall risk.    Status Achieved          Remaining deficits: Unable to assess due to pt self-discharging after 8 visits.    Education / Equipment: HEP  Plan: Patient agrees to discharge.  Patient goals were partially met. Patient is being discharged due to not returning since the last visit.  ?????       Hilma Favors, PT, DPT [phone] 08/14/2015, 8:29 AM  [place], [place], [address] Phone: [phone]   Fax:  [phone]

## 2016-06-29 ENCOUNTER — Ambulatory Visit (HOSPITAL_COMMUNITY)
Admission: RE | Admit: 2016-06-29 | Discharge: 2016-06-29 | Disposition: A | Discharge status: Home / Self Care | Payer: BLUE CROSS/BLUE SHIELD | Source: Ambulatory Visit | Attending: Pulmonary Disease | Admitting: Pulmonary Disease

## 2016-06-29 ENCOUNTER — Other Ambulatory Visit (HOSPITAL_COMMUNITY): Payer: Self-pay | Admitting: Pulmonary Disease

## 2016-06-29 DIAGNOSIS — M1711 Unilateral primary osteoarthritis, right knee: Secondary | ICD-10-CM | POA: Diagnosis not present

## 2016-06-29 DIAGNOSIS — M542 Cervicalgia: Secondary | ICD-10-CM | POA: Diagnosis present

## 2016-06-29 DIAGNOSIS — R52 Pain, unspecified: Secondary | ICD-10-CM

## 2016-06-29 DIAGNOSIS — M25461 Effusion, right knee: Secondary | ICD-10-CM | POA: Insufficient documentation

## 2016-06-29 DIAGNOSIS — M25561 Pain in right knee: Secondary | ICD-10-CM | POA: Insufficient documentation

## 2016-08-03 ENCOUNTER — Encounter: Payer: Self-pay | Admitting: Orthopedic Surgery

## 2017-03-02 DIAGNOSIS — M1711 Unilateral primary osteoarthritis, right knee: Secondary | ICD-10-CM | POA: Diagnosis not present

## 2017-05-14 DIAGNOSIS — M1711 Unilateral primary osteoarthritis, right knee: Secondary | ICD-10-CM | POA: Diagnosis not present

## 2017-05-20 DIAGNOSIS — M1711 Unilateral primary osteoarthritis, right knee: Secondary | ICD-10-CM | POA: Diagnosis not present

## 2017-05-28 DIAGNOSIS — M1711 Unilateral primary osteoarthritis, right knee: Secondary | ICD-10-CM | POA: Diagnosis not present

## 2017-09-01 DIAGNOSIS — Z23 Encounter for immunization: Secondary | ICD-10-CM | POA: Diagnosis not present

## 2017-10-07 DIAGNOSIS — M1711 Unilateral primary osteoarthritis, right knee: Secondary | ICD-10-CM | POA: Diagnosis not present

## 2018-06-02 IMAGING — DX DG CERVICAL SPINE COMPLETE 4+V
5 series · 5 of 5 positions shown · non-contrast
Comparison: Non

CLINICAL DATA: Neck pain which began in RIGHT shoulder and has
radiated up the RIGHT side of neck for 1 week, no known injury

EXAM:
CERVICAL SPINE - COMPLETE 4+ VIEW

[c-spine lat]
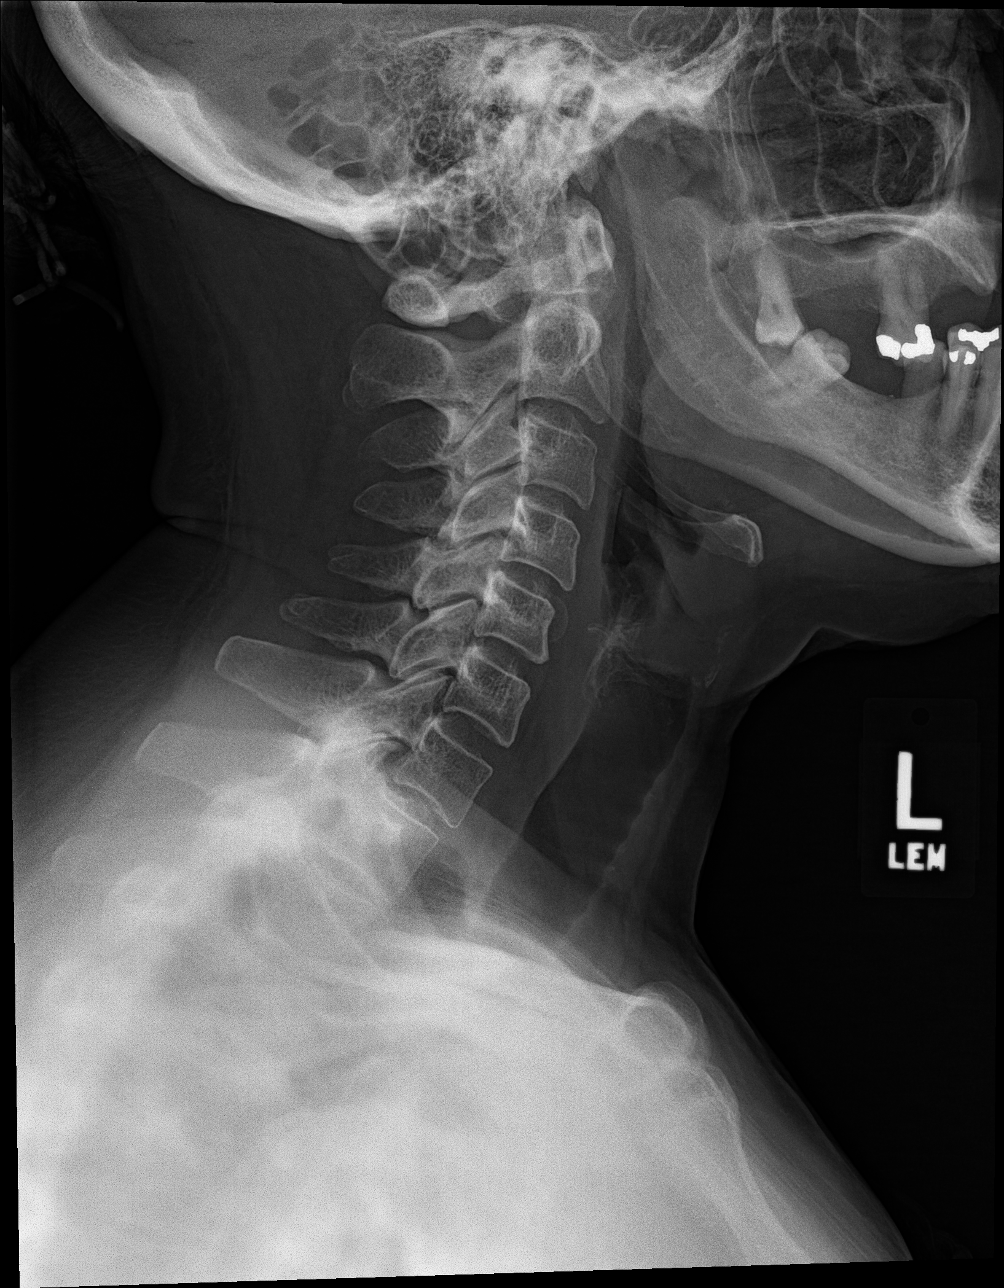

[c-spine obl (1 of 2)]
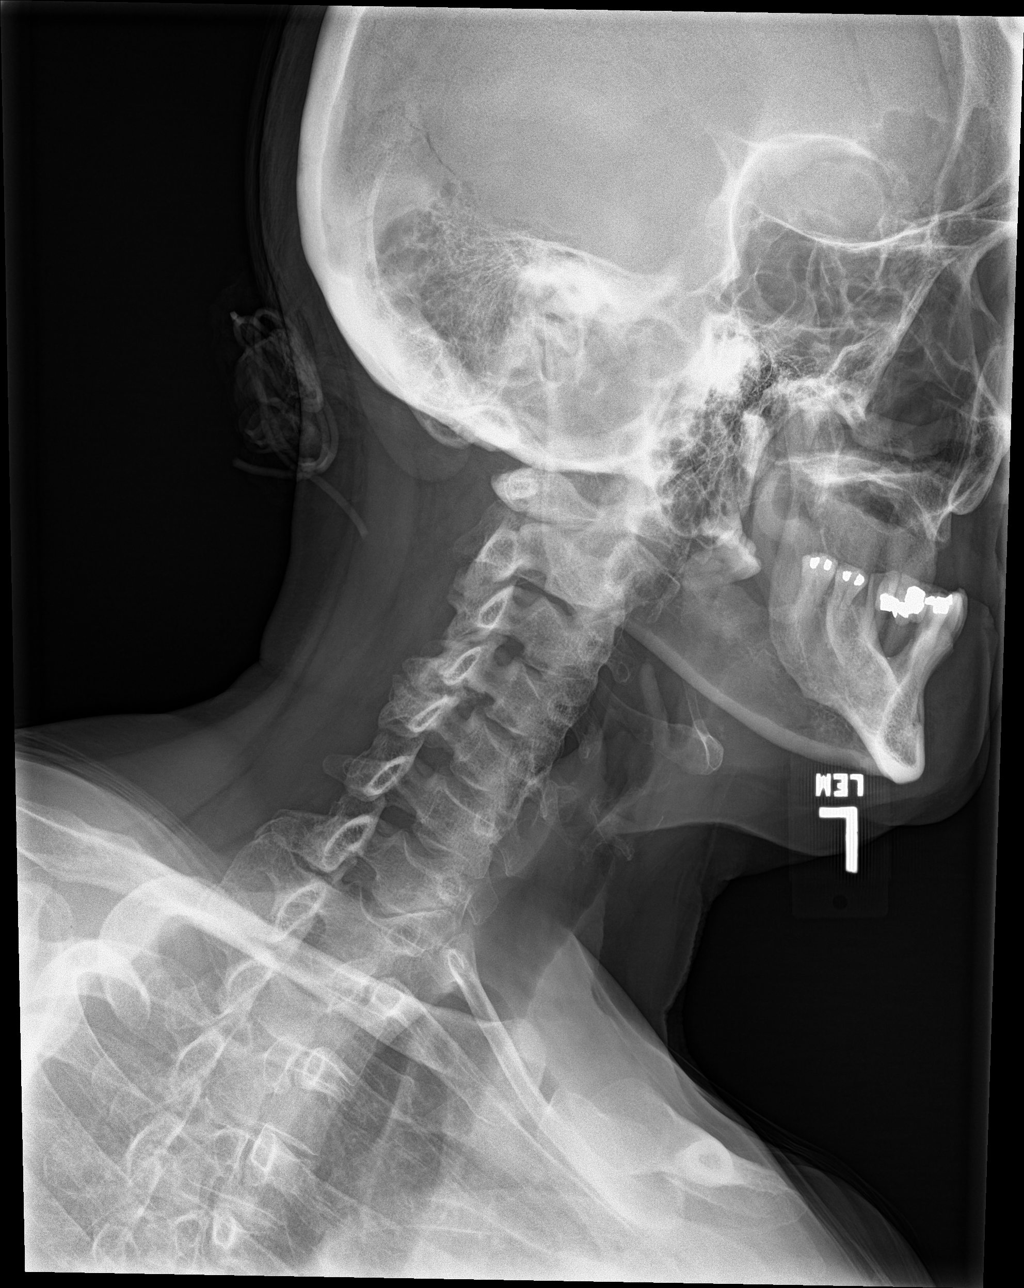

[c-spine obl (2 of 2)]
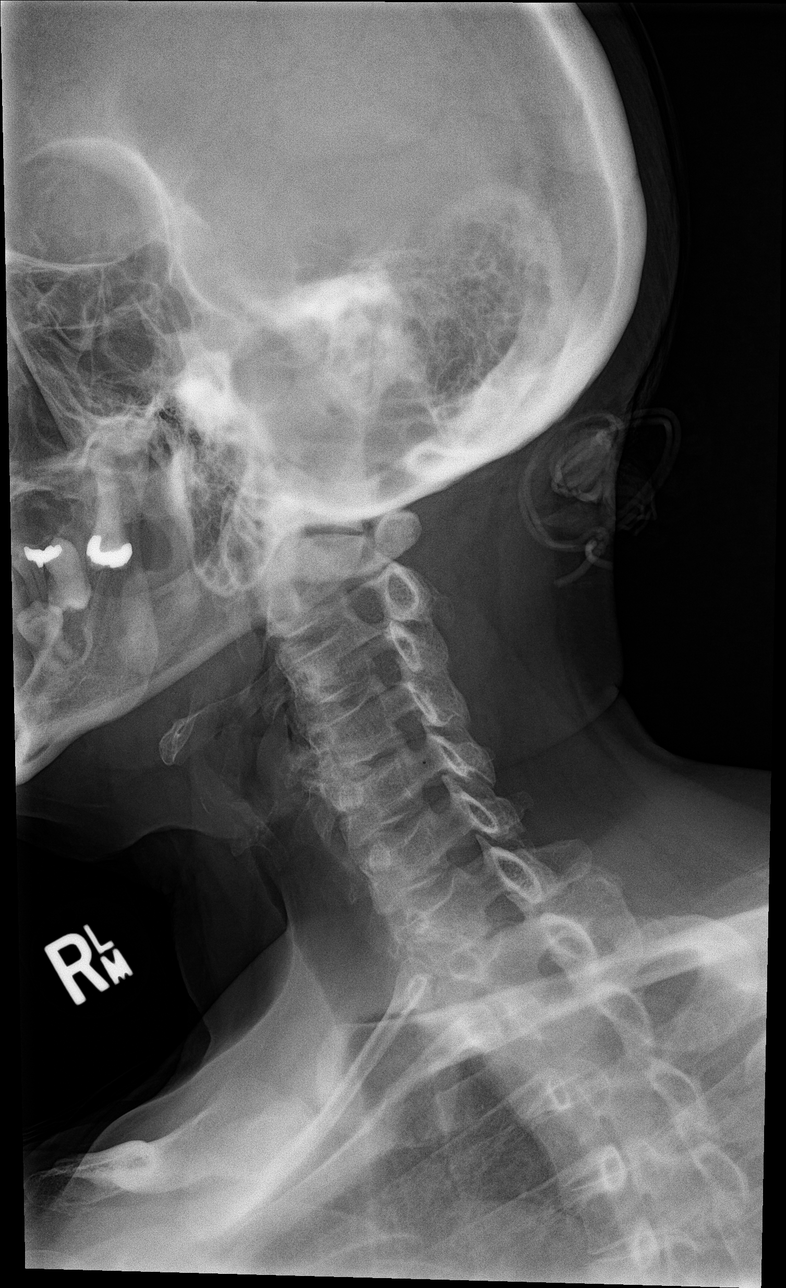

[c-spine ap]
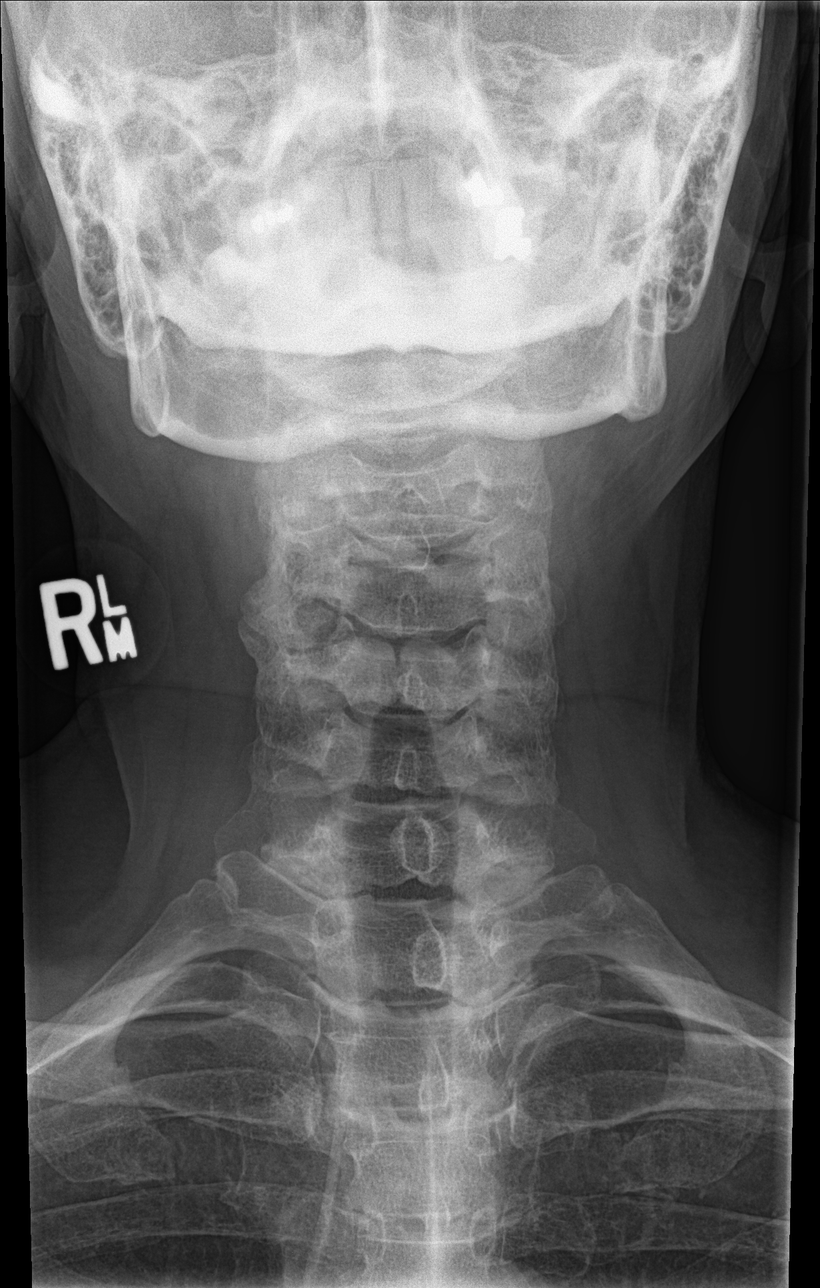

[c-spine open mouth]
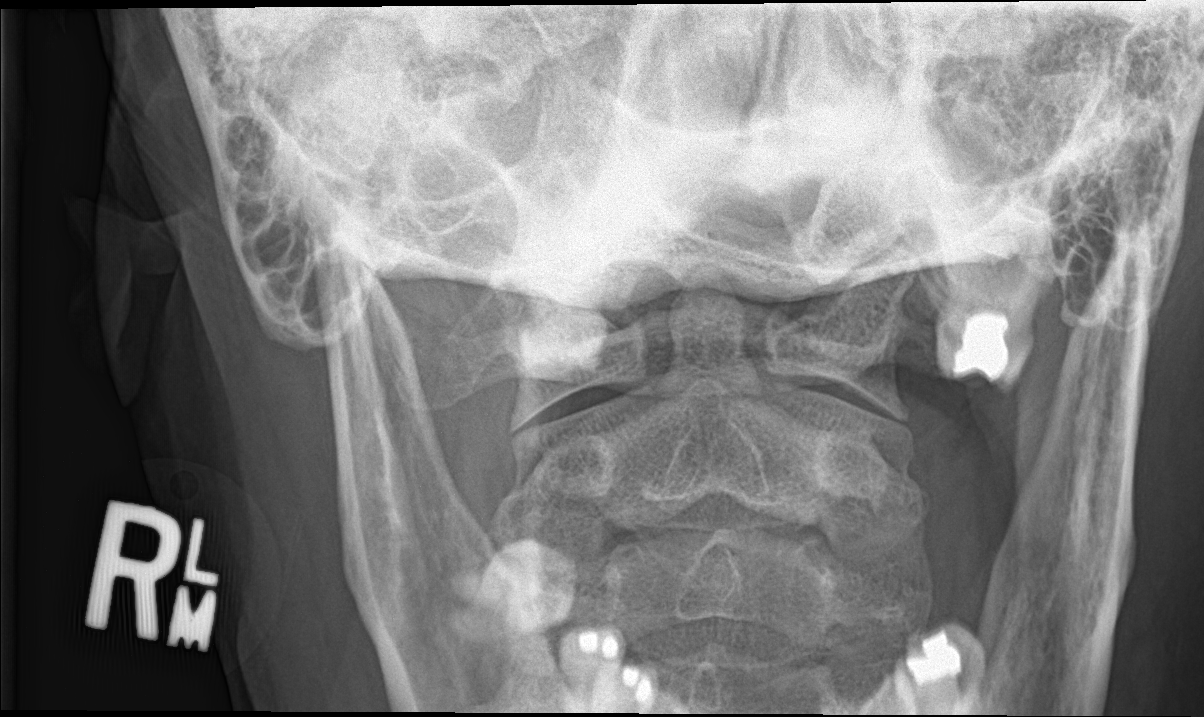

[5 of 5 positions shown; findings below may reference images not displayed]

FINDINGS: Prevertebral soft tissues normal thickness.

Vertebral body and disc space heights maintained.

No acute fracture, subluxation or bone destruction.

Minimal uncovertebral hypertrophy RIGHT C4-C5.

Bony foramina otherwise patent.

Lung apices clear.
IMPRESSION: No acute abnormalities.

Minimal uncovertebral hypertrophy RIGHT C4-C5

## 2018-07-03 ENCOUNTER — Ambulatory Visit (HOSPITAL_COMMUNITY)
Admission: EM | Admit: 2018-07-03 | Discharge: 2018-07-03 | Disposition: A | Discharge status: Home / Self Care | Payer: BLUE CROSS/BLUE SHIELD | Attending: Internal Medicine | Admitting: Internal Medicine

## 2018-07-03 ENCOUNTER — Encounter (HOSPITAL_COMMUNITY): Payer: Self-pay | Admitting: *Deleted

## 2018-07-03 DIAGNOSIS — R51 Headache: Secondary | ICD-10-CM

## 2018-07-03 DIAGNOSIS — R519 Headache, unspecified: Secondary | ICD-10-CM

## 2018-07-03 DIAGNOSIS — R42 Dizziness and giddiness: Secondary | ICD-10-CM

## 2018-07-03 HISTORY — DX: Anemia, unspecified: D64.9

## 2018-07-03 NOTE — ED Triage Notes (Signed)
C/O HA since yesterday. Today started with a "slight dizziness" and some nausea.  Gait normal.

## 2018-07-03 NOTE — ED Provider Notes (Signed)
[facility]    CSN: [id number] Arrival date & time: 07/03/18  1111     History   Chief Complaint Chief Complaint  Patient presents with  . Headache  . Dizziness    HPI [name] is a 62 y.o. female.   62 year old female presents with slight headache that started yesterday afternoon. Headache become more intense in the evening along with some dizziness and felt nauseous. Felt unstable with gait yesterday but has improved today. Denies any fever, nasal congestion, sore throat, blurred vision, cough, abdominal pain, vomiting or diarrhea. No recent travel. Has been eating and drinking as usual but admits to decreasing caffeine in the past 2 days. Does not smoke, drink alcohol or use illicit drugs. She has taken Excedrin and Naproxen with some relief. Has history of occasional mild migraine headache a few times a year but never had the dizziness with her headaches. Feels much better today. Also has history of HTN and currently on Losartan and Lopressor daily.   The history is provided by the patient.    Past Medical History:  Diagnosis Date  . Anemia   . Chest pain   . Hypertension   . Palpitations   . SVT (supraventricular tachycardia) [facility](HCC)     Patient Active Problem List   Diagnosis Date Noted  . Cyst of left breast 12/20/2013  . PALPITATIONS 04/15/2009  . CHEST PAIN-UNSPECIFIED 04/15/2009    Past Surgical History:  Procedure Laterality Date  . TUBAL LIGATION      OB History   None      Home Medications    Prior to Admission medications   Medication Sig Start Date End Date Taking? Authorizing Provider  aspirin 325 MG tablet Take 325 mg by mouth daily.   Yes [provider]  cholecalciferol (VITAMIN D) 1000 UNITS tablet Take 1,000 Units by mouth daily.   Yes [provider]  losartan (COZAAR) 50 MG tablet Take 50 mg by mouth daily.   Yes [provider]  metoprolol (LOPRESSOR) 25 MG tablet Take one tablet daily  04/18/13  Yes Annamarie DawleyStrand, Terry S, MD  Misc Natural Products (RA XYDRA EF PO) Take by mouth.   Yes [provider]  Aspirin-Acetaminophen-Caffeine (EXCEDRIN PO) Take 1 tablet by mouth every 6 (six) hours as needed (pain).    [provider]    Family History Family History  Problem Relation Age of Onset  . Diabetes Other   . COPD Other   . Hypertension Other   . Heart disease Other   . CAD Mother   . CAD Father   . Cancer Father   . COPD Father     Social History Social History   Tobacco Use  . Smoking status: Never Smoker  . Smokeless tobacco: Never Used  Substance Use Topics  . Alcohol use: No  . Drug use: No     Allergies   Lodine [etodolac]   Review of Systems Review of Systems  Constitutional: Positive for fatigue. Negative for activity change, appetite change, chills and fever.  HENT: Negative for congestion, ear discharge, ear pain, facial swelling, hearing loss, mouth sores, nosebleeds, postnasal drip, rhinorrhea, sinus pressure, sinus pain, sneezing, sore throat, tinnitus and trouble swallowing.   Eyes: Negative for photophobia, pain, discharge, redness, itching and visual disturbance.  Respiratory: Negative for cough, chest tightness, shortness of breath and wheezing.   Cardiovascular: Negative for chest pain and palpitations.  Gastrointestinal: Positive for nausea. Negative for abdominal pain, diarrhea  and vomiting.  Genitourinary: Negative for decreased urine volume, difficulty urinating, dysuria, flank pain and hematuria.  Musculoskeletal: Negative for arthralgias, gait problem, myalgias, neck pain and neck stiffness.  Skin: Negative.   Allergic/Immunologic: Negative for immunocompromised state.  Neurological: Positive for dizziness, light-headedness and headaches. Negative for tremors, seizures, syncope, facial asymmetry, speech difficulty, weakness and numbness.  Hematological: Negative for adenopathy. Does not bruise/bleed easily.    Psychiatric/Behavioral: Negative.      Physical Exam Triage Vital Signs ED Triage Vitals [07/03/18 1148]  Enc Vitals Group     BP (!) 114/56     Pulse Rate 72     Resp 14     Temp (!) 97.4 F (36.3 C)     Temp Source Oral     SpO2 100 %     Weight      Height      Head Circumference      Peak Flow      Pain Score 2     Pain Loc      Pain Edu?      Excl. in GC?    No data found.  Updated Vital Signs BP (!) 114/56   Pulse 72   Temp (!) 97.4 F (36.3 C) (Oral)   Resp 14   SpO2 100%   Visual Acuity Right Eye Distance:   Left Eye Distance:   Bilateral Distance:    Right Eye Near:   Left Eye Near:    Bilateral Near:     Physical Exam  Constitutional: She is oriented to person, place, and time. She appears well-developed and well-nourished. She is cooperative. She does not appear ill. No distress.  Patient sitting comfortably in exam chair in no acute distress.   HENT:  Head: Normocephalic and atraumatic.  Right Ear: Hearing, tympanic membrane, external ear and ear canal normal.  Left Ear: Hearing, tympanic membrane, external ear and ear canal normal.  Nose: Nose normal. Right sinus exhibits no maxillary sinus tenderness and no frontal sinus tenderness. Left sinus exhibits no maxillary sinus tenderness and no frontal sinus tenderness.  Mouth/Throat: Uvula is midline, oropharynx is clear and moist and mucous membranes are normal.  Headache pain located mostly in frontal area but no tenderness.   Eyes: Pupils are equal, round, and reactive to light. Conjunctivae and EOM are normal.  Neck: Normal range of motion. Neck supple.  Cardiovascular: Normal rate, regular rhythm and normal heart sounds.  No murmur heard. Pulmonary/Chest: Effort normal and breath sounds normal. No stridor. No respiratory distress. She has no decreased breath sounds. She has no wheezes. She has no rhonchi. She has no rales.  Musculoskeletal: Normal range of motion.  Lymphadenopathy:    She  has no cervical adenopathy.  Neurological: She is alert and oriented to person, place, and time. She has normal strength and normal reflexes. No cranial nerve deficit or sensory deficit. She displays a negative Romberg sign. Coordination and gait normal. GCS eye subscore is 4. GCS verbal subscore is 5. GCS motor subscore is 6.  Skin: Skin is warm and dry. Capillary refill takes less than 2 seconds. No rash noted.  Psychiatric: She has a normal mood and affect. Her behavior is normal. Judgment and thought content normal.  Vitals reviewed.    UC Treatments / Results  Labs (all labs ordered are listed, but only abnormal results are displayed) Labs Reviewed - No data to display  EKG None  Radiology No results found.  Procedures Procedures (including critical care time)  Medications Ordered in UC Medications - No data to display  Initial Impression / Assessment and Plan / UC Course  I have reviewed the triage vital signs and the nursing notes.  Pertinent labs & imaging results that were available during my care of the patient were reviewed by me and considered in my medical decision making (see chart for details).    Discussed uncertain of etiology of headache and dizziness. May be due to decrease in blood pressure or decrease in caffeine. No distinct signs of infection present. Since patient is feeling better and stable, recommend continue to monitor. Increase fluid intake to stay hydrated. Continue to monitor blood pressure- may need to adjust medication. May continue Excedrin as needed for headache. May take OTC Meclizine 25mg  every 8 hours as needed for dizziness. Recommend follow-up with her PCP in 2 to 3 days if symptoms linger or go to the ER ASAP if headache/dizziness worsen or new symptoms develop.  Final Clinical Impressions(s) / UC Diagnoses   Final diagnoses:  Headache in front of head  Lightheadedness     Discharge Instructions     Recommend continue to monitor  symptoms. May continue Excedrin as needed for headache. May try OTC Meclizine 25mg  every 8 hours as needed for dizziness. Increase fluid intake, especially solutions such as Gatorade or Powerade. Follow-up with your PCP in 3 to 4 days if minimal improvement or go to the ER if headache or dizziness worsen or other symptoms occur.     ED Prescriptions    None     Controlled Substance Prescriptions [place] Controlled Substance Registry consulted? Not Applicable   Sudie Grumbling, NP 07/04/18 [phone]

## 2018-07-03 NOTE — Discharge Instructions (Addendum)
Recommend continue to monitor symptoms. May continue Excedrin as needed for headache. May try OTC Meclizine 25mg  every 8 hours as needed for dizziness. Increase fluid intake, especially solutions such as Gatorade or Powerade. Follow-up with your PCP in 3 to 4 days if minimal improvement or go to the ER if headache or dizziness worsen or other symptoms occur.

## 2018-07-28 DIAGNOSIS — Z23 Encounter for immunization: Secondary | ICD-10-CM | POA: Diagnosis not present

## 2019-01-24 DIAGNOSIS — M1711 Unilateral primary osteoarthritis, right knee: Secondary | ICD-10-CM | POA: Diagnosis not present

## 2019-01-24 DIAGNOSIS — M1712 Unilateral primary osteoarthritis, left knee: Secondary | ICD-10-CM | POA: Diagnosis not present

## 2019-01-24 DIAGNOSIS — M170 Bilateral primary osteoarthritis of knee: Secondary | ICD-10-CM | POA: Diagnosis not present

## 2019-05-10 ENCOUNTER — Other Ambulatory Visit: Payer: 59

## 2019-05-10 ENCOUNTER — Other Ambulatory Visit: Payer: Self-pay

## 2019-05-10 DIAGNOSIS — Z20822 Contact with and (suspected) exposure to covid-19: Secondary | ICD-10-CM

## 2019-05-10 DIAGNOSIS — R6889 Other general symptoms and signs: Secondary | ICD-10-CM | POA: Diagnosis not present

## 2019-05-14 LAB — NOVEL CORONAVIRUS, NAA: SARS-CoV-2, NAA: NOT DETECTED

## 2019-06-28 ENCOUNTER — Other Ambulatory Visit: Payer: Self-pay

## 2019-06-28 DIAGNOSIS — Z20822 Contact with and (suspected) exposure to covid-19: Secondary | ICD-10-CM

## 2019-06-28 DIAGNOSIS — R6889 Other general symptoms and signs: Secondary | ICD-10-CM | POA: Diagnosis not present

## 2019-06-28 NOTE — Addendum Note (Signed)
Addended by: Morley Gaumer R on: 06/28/2019 10:22 AM   Modules accepted: Orders

## 2019-06-29 DIAGNOSIS — I10 Essential (primary) hypertension: Secondary | ICD-10-CM | POA: Diagnosis not present

## 2019-06-29 DIAGNOSIS — L03811 Cellulitis of head [any part, except face]: Secondary | ICD-10-CM | POA: Diagnosis not present

## 2019-06-29 DIAGNOSIS — Z23 Encounter for immunization: Secondary | ICD-10-CM | POA: Diagnosis not present

## 2019-06-29 DIAGNOSIS — E785 Hyperlipidemia, unspecified: Secondary | ICD-10-CM | POA: Diagnosis not present

## 2019-06-29 LAB — NOVEL CORONAVIRUS, NAA: SARS-CoV-2, NAA: NOT DETECTED

## 2019-07-26 DIAGNOSIS — I10 Essential (primary) hypertension: Secondary | ICD-10-CM | POA: Diagnosis not present

## 2019-07-26 DIAGNOSIS — L03818 Cellulitis of other sites: Secondary | ICD-10-CM | POA: Diagnosis not present

## 2019-07-26 DIAGNOSIS — E785 Hyperlipidemia, unspecified: Secondary | ICD-10-CM | POA: Diagnosis not present

## 2019-08-08 ENCOUNTER — Other Ambulatory Visit: Payer: Self-pay

## 2019-08-08 DIAGNOSIS — Z20822 Contact with and (suspected) exposure to covid-19: Secondary | ICD-10-CM

## 2019-08-09 DIAGNOSIS — Z23 Encounter for immunization: Secondary | ICD-10-CM | POA: Diagnosis not present

## 2019-08-09 LAB — NOVEL CORONAVIRUS, NAA: SARS-CoV-2, NAA: NOT DETECTED

## 2019-08-25 DIAGNOSIS — D649 Anemia, unspecified: Secondary | ICD-10-CM | POA: Diagnosis not present

## 2019-08-25 DIAGNOSIS — M1990 Unspecified osteoarthritis, unspecified site: Secondary | ICD-10-CM | POA: Diagnosis not present

## 2019-09-21 DIAGNOSIS — M25561 Pain in right knee: Secondary | ICD-10-CM | POA: Diagnosis not present

## 2019-09-21 DIAGNOSIS — M25562 Pain in left knee: Secondary | ICD-10-CM | POA: Diagnosis not present

## 2019-11-22 DIAGNOSIS — M2012 Hallux valgus (acquired), left foot: Secondary | ICD-10-CM | POA: Diagnosis not present

## 2019-11-22 DIAGNOSIS — M21611 Bunion of right foot: Secondary | ICD-10-CM | POA: Diagnosis not present

## 2019-11-22 DIAGNOSIS — M19071 Primary osteoarthritis, right ankle and foot: Secondary | ICD-10-CM | POA: Diagnosis not present

## 2019-11-22 DIAGNOSIS — M79671 Pain in right foot: Secondary | ICD-10-CM | POA: Diagnosis not present

## 2020-01-14 DIAGNOSIS — Z20828 Contact with and (suspected) exposure to other viral communicable diseases: Secondary | ICD-10-CM | POA: Diagnosis not present

## 2020-01-22 DIAGNOSIS — R05 Cough: Secondary | ICD-10-CM | POA: Diagnosis not present

## 2020-01-22 DIAGNOSIS — Z20828 Contact with and (suspected) exposure to other viral communicable diseases: Secondary | ICD-10-CM | POA: Diagnosis not present

## 2020-02-04 DIAGNOSIS — Z20828 Contact with and (suspected) exposure to other viral communicable diseases: Secondary | ICD-10-CM | POA: Diagnosis not present

## 2020-02-11 DIAGNOSIS — Z20828 Contact with and (suspected) exposure to other viral communicable diseases: Secondary | ICD-10-CM | POA: Diagnosis not present

## 2020-02-11 DIAGNOSIS — R05 Cough: Secondary | ICD-10-CM | POA: Diagnosis not present

## 2020-02-24 DIAGNOSIS — R05 Cough: Secondary | ICD-10-CM | POA: Diagnosis not present

## 2020-02-24 DIAGNOSIS — Z20828 Contact with and (suspected) exposure to other viral communicable diseases: Secondary | ICD-10-CM | POA: Diagnosis not present

## 2020-02-24 DIAGNOSIS — J310 Chronic rhinitis: Secondary | ICD-10-CM | POA: Diagnosis not present

## 2020-02-26 DIAGNOSIS — Z20828 Contact with and (suspected) exposure to other viral communicable diseases: Secondary | ICD-10-CM | POA: Diagnosis not present

## 2020-04-30 DIAGNOSIS — R197 Diarrhea, unspecified: Secondary | ICD-10-CM | POA: Diagnosis not present

## 2020-04-30 DIAGNOSIS — Z20828 Contact with and (suspected) exposure to other viral communicable diseases: Secondary | ICD-10-CM | POA: Diagnosis not present

## 2020-04-30 DIAGNOSIS — R52 Pain, unspecified: Secondary | ICD-10-CM | POA: Diagnosis not present

## 2020-06-05 DIAGNOSIS — M170 Bilateral primary osteoarthritis of knee: Secondary | ICD-10-CM | POA: Diagnosis not present

## 2020-07-11 DIAGNOSIS — Z20828 Contact with and (suspected) exposure to other viral communicable diseases: Secondary | ICD-10-CM | POA: Diagnosis not present

## 2020-07-18 DIAGNOSIS — Z20828 Contact with and (suspected) exposure to other viral communicable diseases: Secondary | ICD-10-CM | POA: Diagnosis not present

## 2020-07-18 DIAGNOSIS — R05 Cough: Secondary | ICD-10-CM | POA: Diagnosis not present

## 2020-07-18 DIAGNOSIS — I10 Essential (primary) hypertension: Secondary | ICD-10-CM | POA: Diagnosis not present

## 2020-07-18 DIAGNOSIS — R52 Pain, unspecified: Secondary | ICD-10-CM | POA: Diagnosis not present

## 2020-08-19 DIAGNOSIS — J019 Acute sinusitis, unspecified: Secondary | ICD-10-CM | POA: Diagnosis not present

## 2020-08-19 DIAGNOSIS — R519 Headache, unspecified: Secondary | ICD-10-CM | POA: Diagnosis not present

## 2020-08-19 DIAGNOSIS — I10 Essential (primary) hypertension: Secondary | ICD-10-CM | POA: Diagnosis not present

## 2020-08-19 DIAGNOSIS — Z20828 Contact with and (suspected) exposure to other viral communicable diseases: Secondary | ICD-10-CM | POA: Diagnosis not present

## 2020-08-19 DIAGNOSIS — R0981 Nasal congestion: Secondary | ICD-10-CM | POA: Diagnosis not present

## 2023-10-07 ENCOUNTER — Other Ambulatory Visit: Payer: Self-pay | Admitting: Internal Medicine

## 2023-10-07 DIAGNOSIS — Z1231 Encounter for screening mammogram for malignant neoplasm of breast: Secondary | ICD-10-CM

## 2023-11-23 ENCOUNTER — Ambulatory Visit
Admission: RE | Admit: 2023-11-23 | Discharge: 2023-11-23 | Disposition: A | Discharge status: Home / Self Care | Payer: BC Managed Care – PPO | Source: Ambulatory Visit | Attending: Family Medicine | Admitting: Family Medicine

## 2023-11-23 DIAGNOSIS — Z1231 Encounter for screening mammogram for malignant neoplasm of breast: Secondary | ICD-10-CM

## 2023-11-29 ENCOUNTER — Other Ambulatory Visit: Payer: Self-pay | Admitting: Internal Medicine

## 2023-11-29 DIAGNOSIS — R928 Other abnormal and inconclusive findings on diagnostic imaging of breast: Secondary | ICD-10-CM

## 2023-12-11 ENCOUNTER — Ambulatory Visit: Payer: BC Managed Care – PPO

## 2023-12-11 ENCOUNTER — Ambulatory Visit
Admission: RE | Admit: 2023-12-11 | Discharge: 2023-12-11 | Disposition: A | Discharge status: Home / Self Care | Payer: BC Managed Care – PPO | Source: Ambulatory Visit | Attending: Internal Medicine | Admitting: Internal Medicine

## 2023-12-11 DIAGNOSIS — R928 Other abnormal and inconclusive findings on diagnostic imaging of breast: Secondary | ICD-10-CM

## 2024-07-12 DIAGNOSIS — Z299 Encounter for prophylactic measures, unspecified: Secondary | ICD-10-CM | POA: Diagnosis not present

## 2024-07-12 DIAGNOSIS — J029 Acute pharyngitis, unspecified: Secondary | ICD-10-CM | POA: Diagnosis not present

## 2024-07-12 DIAGNOSIS — R059 Cough, unspecified: Secondary | ICD-10-CM | POA: Diagnosis not present

## 2024-07-12 DIAGNOSIS — I10 Essential (primary) hypertension: Secondary | ICD-10-CM | POA: Diagnosis not present

## 2024-07-12 DIAGNOSIS — J069 Acute upper respiratory infection, unspecified: Secondary | ICD-10-CM | POA: Diagnosis not present

## 2024-08-25 DIAGNOSIS — I10 Essential (primary) hypertension: Secondary | ICD-10-CM | POA: Diagnosis not present

## 2024-08-25 DIAGNOSIS — M6740 Ganglion, unspecified site: Secondary | ICD-10-CM | POA: Diagnosis not present

## 2024-08-25 DIAGNOSIS — F419 Anxiety disorder, unspecified: Secondary | ICD-10-CM | POA: Diagnosis not present

## 2024-08-25 DIAGNOSIS — Z23 Encounter for immunization: Secondary | ICD-10-CM | POA: Diagnosis not present

## 2024-08-25 DIAGNOSIS — Z299 Encounter for prophylactic measures, unspecified: Secondary | ICD-10-CM | POA: Diagnosis not present
